# Patient Record
Sex: Female | Born: 1955
Health system: Southern US, Community
[De-identification: ages and names within clinical notes are randomized; demographics above are authoritative.]

## PROBLEM LIST (undated history)

## (undated) DIAGNOSIS — J45909 Unspecified asthma, uncomplicated: Secondary | ICD-10-CM

## (undated) DIAGNOSIS — M549 Dorsalgia, unspecified: Secondary | ICD-10-CM

## (undated) HISTORY — PX: BACK SURGERY: SHX140

---

## 2002-03-03 ENCOUNTER — Encounter: Admission: RE | Admit: 2002-03-03 | Discharge: 2002-03-03 | Payer: Self-pay | Admitting: Family Medicine

## 2002-03-03 ENCOUNTER — Encounter: Payer: Self-pay | Admitting: Family Medicine

## 2002-03-15 ENCOUNTER — Observation Stay (HOSPITAL_COMMUNITY): Admission: RE | Admit: 2002-03-15 | Discharge: 2002-03-16 | Payer: Self-pay | Admitting: General Surgery

## 2003-01-18 ENCOUNTER — Encounter: Payer: Self-pay | Admitting: *Deleted

## 2003-01-18 ENCOUNTER — Emergency Department (HOSPITAL_COMMUNITY): Admission: EM | Admit: 2003-01-18 | Discharge: 2003-01-18 | Payer: Self-pay | Admitting: *Deleted

## 2003-07-15 ENCOUNTER — Encounter: Payer: Self-pay | Admitting: Family Medicine

## 2003-07-15 ENCOUNTER — Ambulatory Visit (HOSPITAL_COMMUNITY): Admission: RE | Admit: 2003-07-15 | Discharge: 2003-07-15 | Payer: Self-pay | Admitting: Family Medicine

## 2003-07-25 ENCOUNTER — Encounter: Payer: Self-pay | Admitting: Family Medicine

## 2003-07-25 ENCOUNTER — Ambulatory Visit (HOSPITAL_COMMUNITY): Admission: RE | Admit: 2003-07-25 | Discharge: 2003-07-25 | Payer: Self-pay | Admitting: Family Medicine

## 2003-07-26 ENCOUNTER — Encounter: Payer: Self-pay | Admitting: Family Medicine

## 2003-07-26 ENCOUNTER — Ambulatory Visit (HOSPITAL_COMMUNITY): Admission: RE | Admit: 2003-07-26 | Discharge: 2003-07-26 | Payer: Self-pay | Admitting: Family Medicine

## 2003-08-03 ENCOUNTER — Ambulatory Visit (HOSPITAL_COMMUNITY): Admission: RE | Admit: 2003-08-03 | Discharge: 2003-08-03 | Payer: Self-pay | Admitting: Pulmonary Disease

## 2003-11-14 ENCOUNTER — Ambulatory Visit (HOSPITAL_COMMUNITY): Admission: RE | Admit: 2003-11-14 | Discharge: 2003-11-14 | Payer: Self-pay | Admitting: Family Medicine

## 2004-02-07 ENCOUNTER — Ambulatory Visit (HOSPITAL_COMMUNITY): Admission: RE | Admit: 2004-02-07 | Discharge: 2004-02-07 | Payer: Self-pay | Admitting: Family Medicine

## 2004-03-08 ENCOUNTER — Ambulatory Visit (HOSPITAL_COMMUNITY): Admission: RE | Admit: 2004-03-08 | Discharge: 2004-03-08 | Payer: Self-pay | Admitting: Family Medicine

## 2005-08-15 ENCOUNTER — Encounter: Admission: RE | Admit: 2005-08-15 | Discharge: 2005-08-15 | Payer: Self-pay | Admitting: Obstetrics and Gynecology

## 2005-09-04 ENCOUNTER — Encounter: Admission: RE | Admit: 2005-09-04 | Discharge: 2005-09-04 | Payer: Self-pay | Admitting: Obstetrics and Gynecology

## 2006-07-09 ENCOUNTER — Ambulatory Visit (HOSPITAL_COMMUNITY): Admission: RE | Admit: 2006-07-09 | Discharge: 2006-07-09 | Payer: Self-pay | Admitting: Family Medicine

## 2007-02-19 ENCOUNTER — Emergency Department (HOSPITAL_COMMUNITY): Admission: EM | Admit: 2007-02-19 | Discharge: 2007-02-19 | Payer: Self-pay | Admitting: Family Medicine

## 2007-02-24 ENCOUNTER — Inpatient Hospital Stay (HOSPITAL_COMMUNITY): Admission: EM | Admit: 2007-02-24 | Discharge: 2007-02-26 | Payer: Self-pay | Admitting: Emergency Medicine

## 2007-06-23 ENCOUNTER — Ambulatory Visit (HOSPITAL_COMMUNITY): Admission: RE | Admit: 2007-06-23 | Discharge: 2007-06-23 | Payer: Self-pay | Admitting: Family Medicine

## 2008-10-19 ENCOUNTER — Emergency Department (HOSPITAL_COMMUNITY): Admission: EM | Admit: 2008-10-19 | Discharge: 2008-10-19 | Payer: Self-pay | Admitting: Emergency Medicine

## 2008-12-13 ENCOUNTER — Emergency Department (HOSPITAL_COMMUNITY): Admission: EM | Admit: 2008-12-13 | Discharge: 2008-12-13 | Payer: Self-pay | Admitting: Family Medicine

## 2010-03-12 ENCOUNTER — Ambulatory Visit (HOSPITAL_COMMUNITY): Admission: RE | Admit: 2010-03-12 | Discharge: 2010-03-12 | Payer: Self-pay | Admitting: Family Medicine

## 2010-03-17 ENCOUNTER — Emergency Department (HOSPITAL_COMMUNITY): Admission: EM | Admit: 2010-03-17 | Discharge: 2010-03-17 | Payer: Self-pay | Admitting: Emergency Medicine

## 2010-04-05 ENCOUNTER — Ambulatory Visit (HOSPITAL_COMMUNITY): Admission: RE | Admit: 2010-04-05 | Discharge: 2010-04-05 | Payer: Self-pay | Admitting: Family Medicine

## 2010-04-11 ENCOUNTER — Encounter (HOSPITAL_COMMUNITY): Admission: RE | Admit: 2010-04-11 | Discharge: 2010-04-11 | Payer: Self-pay | Admitting: Family Medicine

## 2010-11-28 ENCOUNTER — Emergency Department (HOSPITAL_COMMUNITY)
Admission: EM | Admit: 2010-11-28 | Discharge: 2010-11-28 | Payer: Self-pay | Source: Home / Self Care | Admitting: Emergency Medicine

## 2010-12-11 ENCOUNTER — Ambulatory Visit (HOSPITAL_COMMUNITY)
Admission: RE | Admit: 2010-12-11 | Discharge: 2010-12-11 | Payer: Self-pay | Source: Home / Self Care | Attending: Internal Medicine | Admitting: Internal Medicine

## 2010-12-20 ENCOUNTER — Ambulatory Visit (HOSPITAL_COMMUNITY)
Admission: RE | Admit: 2010-12-20 | Discharge: 2010-12-21 | Payer: Self-pay | Source: Home / Self Care | Attending: Neurosurgery | Admitting: Neurosurgery

## 2010-12-24 LAB — URINALYSIS, ROUTINE W REFLEX MICROSCOPIC
Bilirubin Urine: NEGATIVE
Hgb urine dipstick: NEGATIVE
Ketones, ur: NEGATIVE mg/dL
Nitrite: NEGATIVE
Protein, ur: NEGATIVE mg/dL
Specific Gravity, Urine: 1.024 (ref 1.005–1.030)
Urine Glucose, Fasting: NEGATIVE mg/dL
Urobilinogen, UA: 0.2 mg/dL (ref 0.0–1.0)
pH: 6 (ref 5.0–8.0)

## 2010-12-24 LAB — BASIC METABOLIC PANEL
BUN: 15 mg/dL (ref 6–23)
CO2: 25 mEq/L (ref 19–32)
Calcium: 9.3 mg/dL (ref 8.4–10.5)
Chloride: 103 mEq/L (ref 96–112)
Creatinine, Ser: 0.91 mg/dL (ref 0.4–1.2)
GFR calc Af Amer: >60 mL/min (ref >60)
GFR calc non Af Amer: >60 mL/min (ref >60)
Glucose, Bld: 112 mg/dL — ABNORMAL HIGH (ref 70–99)
Potassium: 3.6 mEq/L (ref 3.5–5.1)
Sodium: 138 mEq/L (ref 135–145)

## 2010-12-24 LAB — PROTIME-INR
INR: 0.99 (ref 0.00–1.49)
Prothrombin Time: 13.3 seconds (ref 11.6–15.2)

## 2010-12-24 LAB — CBC
HCT: 37.6 % (ref 36.0–46.0)
Hemoglobin: 12.7 g/dL (ref 12.0–15.0)
MCH: 30.5 pg (ref 26.0–34.0)
MCHC: 33.8 g/dL (ref 30.0–36.0)
MCV: 90.4 fL (ref 78.0–100.0)
Platelets: 277 10*3/uL (ref 150–400)
RBC: 4.16 MIL/uL (ref 3.87–5.11)
RDW: 12.5 % (ref 11.5–15.5)
WBC: 7.3 10*3/uL (ref 4.0–10.5)

## 2010-12-24 LAB — SURGICAL PCR SCREEN
MRSA, PCR: NEGATIVE
Staphylococcus aureus: NEGATIVE

## 2010-12-24 LAB — APTT: aPTT: 30 seconds (ref 24–37)

## 2010-12-30 ENCOUNTER — Encounter: Payer: Self-pay | Admitting: Orthopedic Surgery

## 2010-12-30 ENCOUNTER — Encounter: Payer: Self-pay | Admitting: Obstetrics and Gynecology

## 2010-12-31 NOTE — Op Note (Signed)
[name], [name]               ACCOUNT NO.:  [id number]  MEDICAL RECORD NO.:  [id number]          PATIENT TYPE:  OIB  LOCATION:  3006                         FACILITY:  [facility]  PHYSICIAN:  [name], M.D.  DATE OF BIRTH:  11-08-1956  DATE OF PROCEDURE:  12/20/2010 DATE OF DISCHARGE:                              OPERATIVE REPORT   PREOPERATIVE DIAGNOSIS:  Herniated nucleus pulposus right L4-L5 with radiculopathy.  POSTOPERATIVE DIAGNOSIS:  Herniated nucleus pulposus right L4-L5 with radiculopathy.  PROCEDURE:  Right L4-L5 semi-hemilaminectomy and diskectomy, microdissection with microscope.  SURGEON:  Emily Fake, MD  ASSISTANT:  Emily Dama, MD  General endotracheal tube anesthesia.  ESTIMATED BLOOD LOSS:  Minimal.  BLOOD GIVEN:  None.  DRAINS:  None.  COMPLICATIONS:  None.  REASON FOR PROCEDURE:  The patient is a 55 year old right-handed woman who has had a 2-3-week history of severe back and right leg pain with numbness and tingling and some weakness in right EHL, decreased sensation in the right L5 distribution.  MRI was done showing a large disk herniation with caudal extension of a fragment in the right L4-L5. The patient was brought in for decompression and diskectomy.  PROCEDURE IN DETAIL:  The patient was brought in the operating room. General anesthesia was induced.  The patient was placed in prone position on a Wilson frame with all pressure points padded.  The patient was prepped and draped in sterile fashion.  The site of incision was injected with 10 mL of 1% lidocaine with epinephrine.  Needle was placed in interspace.  X-rays were obtained showing the needle was pointing more towards the L3-L4 interspace.  An incision was then made centered below where the needle was.  Incision was taken down to the fascia. Hemostasis was obtained with Bovie cauterization.  The fascia was incised with Bovie and subperiosteal dissection was done over  the spinous processes and lamina of L4 and L5 out to the facets.  Self- retaining retractors were placed.  Markers were placed in the interspace.  Another x-ray was obtained and this confirmed our positioning at the L4-L5 level.  Microscope was brought in for microdissection.  At this point, a high-speed drill was used to start decompressive semi-hemilaminectomy and medial facetectomy.  This was continued with Kerrison punches and ligamentum flavum was removed, decompressing the canal.  Therefore at the L5, we explored the epidural space and found large free fragments of disk up under the L5 nerve root below the disk space.  Using various hooks, we were able to mobilize off the free fragments of disk and removed the disk in several pieces.  We continued exploring interspace from the L5 __________ foramen, but could find no more fragments of disk.  We explored the disk space and though there was a disk bulge there, there was no obvious opening into the disk space and we did not open the disk space.  Again, we explored looking for more fragments of disk and did not find any.  We had good decompression of the thecal sac and the L5 root.  We then explored the foramen in the L4 root  which went out of its foramen without any compression.  We had hemostasis with Gelfoam and thrombin.  This was then irrigated out.  We irrigated with antibiotic solution.  We had good hemostasis.  Retractors were removed.  The fascia closed with 0 Vicryl interrupted sutures.  Subcutaneous tissue closed with 0, 2-0, and 3-0 Vicryl interrupted sutures.  Skin closed with benzoin and Steri-Strips. Dressing was placed.  The patient was placed back into supine position, awoken from anesthesia, and transferred to recovery room in stable condition.          ______________________________ [name], M.D.     JRH/MEDQ  D:  12/20/2010  T:  12/21/2010  Job:  [id number]  Electronically Signed by Colon Branch M.D. on  12/31/2010 12:38:11 PM

## 2011-02-19 IMAGING — CR DG CHEST 2V
2 series · 2 of 2 positions shown · non-contrast
Comparison: 02/24/2007

CLINICAL DATA: Posterior mid back pain into right side, asthma,
fatigue

CHEST - 2 VIEW

[view not recorded (1 of 2)]
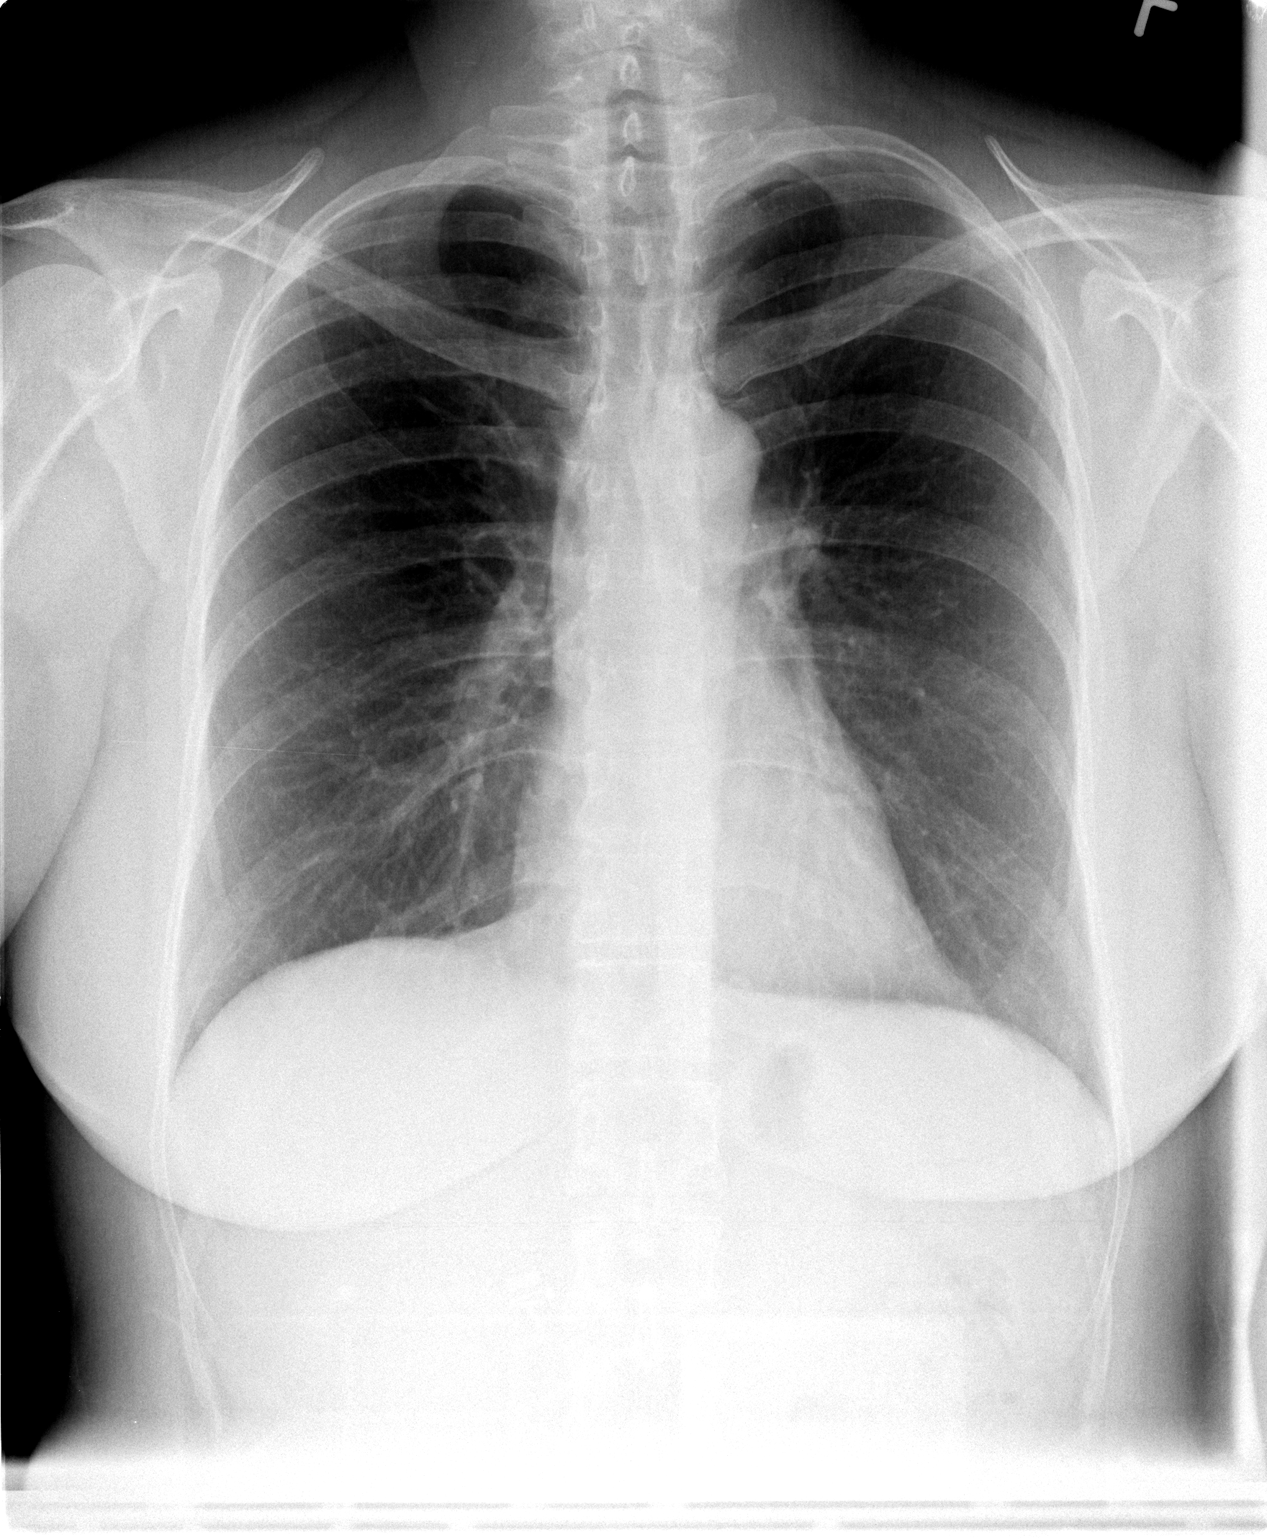

[view not recorded (2 of 2)]
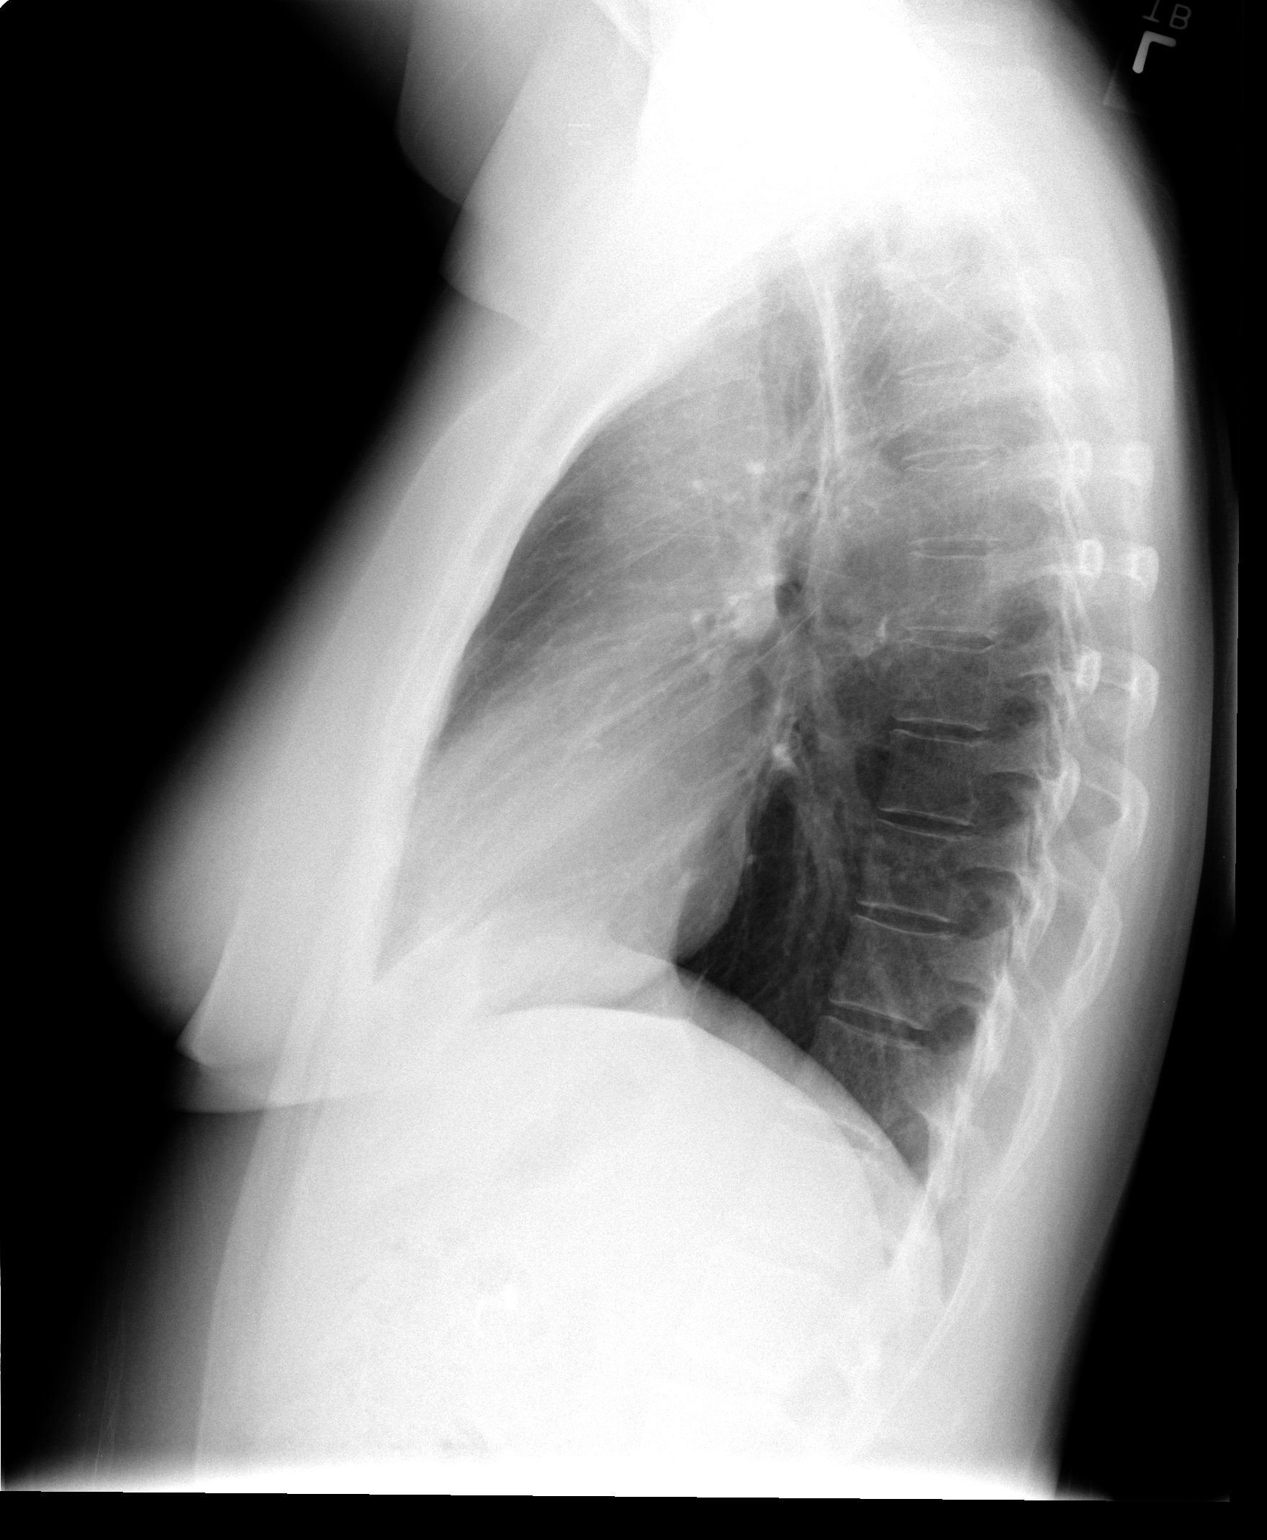

[2 of 2 positions shown; findings below may reference images not displayed]

FINDINGS: Normal heart size, mediastinal contours, and pulmonary vascularity.
Lungs clear.
No pleural effusion or pneumothorax.
IMPRESSION: No acute abnormalities.

## 2011-02-27 LAB — COMPREHENSIVE METABOLIC PANEL
ALT: 19 U/L (ref 0–35)
AST: 29 U/L (ref 0–37)
Calcium: 9.3 mg/dL (ref 8.4–10.5)
GFR calc Af Amer: >60 mL/min (ref >60)
Sodium: 137 mEq/L (ref 135–145)
Total Protein: 7.5 g/dL (ref 6.0–8.3)

## 2011-02-27 LAB — URINALYSIS, ROUTINE W REFLEX MICROSCOPIC
Glucose, UA: NEGATIVE mg/dL
Hgb urine dipstick: NEGATIVE
Ketones, ur: NEGATIVE mg/dL
Protein, ur: NEGATIVE mg/dL

## 2011-02-27 LAB — CBC
MCHC: 34.2 g/dL (ref 30.0–36.0)
RDW: 13.2 % (ref 11.5–15.5)

## 2011-02-27 LAB — URINE MICROSCOPIC-ADD ON

## 2011-02-27 LAB — DIFFERENTIAL
Eosinophils Absolute: 0.1 10*3/uL (ref 0.0–0.7)
Eosinophils Relative: 1 % (ref 0–5)
Lymphs Abs: 2.4 10*3/uL (ref 0.7–4.0)
Monocytes Relative: 7 % (ref 3–12)

## 2011-03-01 ENCOUNTER — Emergency Department (HOSPITAL_COMMUNITY)
Admission: EM | Admit: 2011-03-01 | Discharge: 2011-03-02 | Disposition: A | Discharge status: Home / Self Care | Payer: 59 | Attending: Emergency Medicine | Admitting: Emergency Medicine

## 2011-03-01 DIAGNOSIS — R109 Unspecified abdominal pain: Secondary | ICD-10-CM | POA: Insufficient documentation

## 2011-03-01 DIAGNOSIS — J45909 Unspecified asthma, uncomplicated: Secondary | ICD-10-CM | POA: Insufficient documentation

## 2011-03-01 DIAGNOSIS — R319 Hematuria, unspecified: Secondary | ICD-10-CM | POA: Insufficient documentation

## 2011-03-01 LAB — URINALYSIS, ROUTINE W REFLEX MICROSCOPIC
Bilirubin Urine: NEGATIVE
Glucose, UA: NEGATIVE mg/dL
Ketones, ur: NEGATIVE mg/dL
Nitrite: NEGATIVE
Protein, ur: NEGATIVE mg/dL
Specific Gravity, Urine: 1.024 (ref 1.005–1.030)
Urobilinogen, UA: 0.2 mg/dL (ref 0.0–1.0)
pH: 6 (ref 5.0–8.0)

## 2011-03-01 LAB — URINE MICROSCOPIC-ADD ON

## 2011-03-02 ENCOUNTER — Emergency Department (HOSPITAL_COMMUNITY): Payer: 59

## 2011-04-02 ENCOUNTER — Other Ambulatory Visit (HOSPITAL_COMMUNITY): Payer: Self-pay | Admitting: Neurosurgery

## 2011-04-02 ENCOUNTER — Ambulatory Visit (HOSPITAL_COMMUNITY): Admission: RE | Admit: 2011-04-02 | Payer: 59 | Source: Ambulatory Visit

## 2011-04-02 ENCOUNTER — Ambulatory Visit (HOSPITAL_COMMUNITY)
Admission: RE | Admit: 2011-04-02 | Discharge: 2011-04-02 | Disposition: A | Discharge status: Home / Self Care | Payer: 59 | Source: Ambulatory Visit | Attending: Neurosurgery | Admitting: Neurosurgery

## 2011-04-02 DIAGNOSIS — IMO0002 Reserved for concepts with insufficient information to code with codable children: Secondary | ICD-10-CM

## 2011-04-02 DIAGNOSIS — M545 Low back pain, unspecified: Secondary | ICD-10-CM | POA: Insufficient documentation

## 2011-04-02 DIAGNOSIS — M549 Dorsalgia, unspecified: Secondary | ICD-10-CM

## 2011-04-02 DIAGNOSIS — M792 Neuralgia and neuritis, unspecified: Secondary | ICD-10-CM

## 2011-04-02 DIAGNOSIS — M51379 Other intervertebral disc degeneration, lumbosacral region without mention of lumbar back pain or lower extremity pain: Secondary | ICD-10-CM | POA: Insufficient documentation

## 2011-04-02 DIAGNOSIS — M5106 Intervertebral disc disorders with myelopathy, lumbar region: Secondary | ICD-10-CM

## 2011-04-02 DIAGNOSIS — G959 Disease of spinal cord, unspecified: Secondary | ICD-10-CM

## 2011-04-02 DIAGNOSIS — R209 Unspecified disturbances of skin sensation: Secondary | ICD-10-CM | POA: Insufficient documentation

## 2011-04-02 DIAGNOSIS — M519 Unspecified thoracic, thoracolumbar and lumbosacral intervertebral disc disorder: Secondary | ICD-10-CM

## 2011-04-02 DIAGNOSIS — M4716 Other spondylosis with myelopathy, lumbar region: Secondary | ICD-10-CM

## 2011-04-02 DIAGNOSIS — M5137 Other intervertebral disc degeneration, lumbosacral region: Secondary | ICD-10-CM | POA: Insufficient documentation

## 2011-04-02 MED ORDER — GADOBENATE DIMEGLUMINE 529 MG/ML IV SOLN
20.0000 mL | Freq: Once | INTRAVENOUS | Status: AC | PRN
  Administered 2011-04-02: 20 mL via INTRAVENOUS

## 2011-04-26 NOTE — Discharge Summary (Signed)
[name], [name]               ACCOUNT NO.:  [id number]   MEDICAL RECORD NO.:  [id number]          PATIENT TYPE:  INP   LOCATION:  A209                          FACILITY:  [facility]   PHYSICIAN:  Kirk Ruths, M.D.DATE OF BIRTH:  17-May-1956   DATE OF ADMISSION:  02/24/2007  DATE OF DISCHARGE:  03/20/2008LH                               DISCHARGE SUMMARY   DISCHARGE DIAGNOSES:  1. Status asthmaticus.  2. Respiratory alkalosis.  3. Hypokalemia.   HOSPITAL COURSE:  This 55 year old female presented to the emergency  room after a week-long history of upper respiratory infection.  The  patient had been seen in the office twice the past week and treated with  cough medicines, albuterol and Avelox, but the patient got progressively  short of breath.  In the emergency room, she was found to have a  respiratory alkalosis, extremely tachypneic and her D-dimer was slightly  up at 0.6.  The patient was admitted to the floor and begun on IV  steroids.  Even though she had reported she was allergic to prednisone,  she had no problem with the Solu-Medrol.  She also was given nebulizers.  The patient also had a CT on admission which showed no evidence of  pulmonary embolus.  The patient's status asthmaticus broke after the  second nebulizer treatment and a dose of Solu-Medrol.  By the following  morning, she was essentially clear.  The patient continued to receive  Solu-Medrol and nebulizers.  Her potassium was replaced, since it was  3.2 on admission.  She was also put given IV Levaquin and Singulair.  The patient was stable the second hospital day and was discharged home  on an albuterol nebulizer, hydrocodone cough syrup and Avelox and will  be followed in the office as needed.  Also, she was on Singulair and  prednisone taper.      Kirk Ruths, M.D.  Electronically Signed     WMM/MEDQ  D:  04/01/2007  T:  04/01/2007  Job:  [id number]

## 2011-04-26 NOTE — H&P (Signed)
NAMENEAVE, LENGER               ACCOUNT NO.:  [id number]   MEDICAL RECORD NO.:  [id number]          PATIENT TYPE:  INP   LOCATION:  A209                          FACILITY:  [facility]   PHYSICIAN:  Kirk Ruths, M.D.DATE OF BIRTH:  [birth year]   DATE OF ADMISSION:  02/24/2007  DATE OF DISCHARGE:  LH                              HISTORY & PHYSICAL   CHIEF COMPLAINT:  Shortness of breath.   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  The patient was seen in urgent care and treated with some  albuterol and cough medicine.  She continued to have progressive  shortness of breath, seen by Dr. Phillips Odor and begun on Avelox.  The  patient has continued with her shortness of breath and nonproductive  cough.   She is seen in the emergency room where she is found to have a  respiratory alkalosis, extremely tachypneic, approximately 30  respiratory rate.  Lungs with a very faint expiratory wheeze.  Her D-  dimer was slightly up at 0.6.  subsequent CT showed no evidence of  pulmonary embolism.  The patient was refractory to albuterol/Atrovent  nebulizers while in the ER, so she was admitted for intensive  respiratory toiletry and further evaluation.   PAST MEDICAL HISTORY:  1. She has a longstanding history of asthma.  She used to be on      Singulair for this but insurance refused to cover it, so it was      discontinued several months ago.  The patient is status post      cholecystectomy and hysterectomy.   ALLERGIES:  1. AUGMENTIN.  2. CODEINE.  3. PREDNISONE.   She does seem to tolerated cough syrup with hydrocodone in it without  problems. The prednisone problem relates to she was on, what she  describes as very high doses, for a thyroid condition when she had  sudden onset of flushing and tachycardia.  She was told that she was  having an allergic reaction to high doses of  prednisone, although the  patient did tolerate 62.5 mg of Solu-Medrol last night.   SOCIAL HISTORY:  She is a nonsmoker, nondrinker.   REVIEW OF SYSTEMS:  Denies chest pain, productive cough, diarrhea,  vomiting, leg pain.   PHYSICAL EXAMINATION:  GENERAL:  She is a well-developed female who is  obviously tachypneic.  VITAL SIGNS:  Temperature 98 degrees, respiratory rate 30, pulse 88 and  regular, blood pressure 130/80.  HEENT:  TMs normal.  Pupils equal, round, reactive to light and  accommodation.  Oropharynx benign.  NECK:  Supple.  No JVD, bruits, or thyromegaly.  RESPIRATORY:  Lungs essentially clear with mild end stage expiratory  wheeze.  CARDIOVASCULAR:  Regular sinus rhythm.  No murmurs, rubs, or gallops.  ABDOMEN:  Soft, nontender.  EXTREMITIES:  No clubbing, cyanosis, or edema.  No tenderness.  NEUROLOGIC:  Grossly intact.   ASSESSMENT:  Probable status asthmaticus, subclinical, although PE must  be entertained.      Kirk Ruths,  M.D.  Electronically Signed     WMM/MEDQ  D:  02/25/2007  T:  02/25/2007  Job:  [id number]

## 2011-04-26 NOTE — H&P (Signed)
[facility]  Patient:    [name], [name] Visit Number: [id number] MRN: [id number]          Service Type: Attending:  Franky Macho, M.D. Dictated by:   Franky Macho, M.D. Adm. Date:  03/15/02   CC:         Almond Lint, M.D., [place], [place]   History and Physical  AGE:  55 years old.  CHIEF COMPLAINT:  Biliary colic secondary to cholelithiasis.  HISTORY OF PRESENT ILLNESS:  The patient is a 55 year old white female who is referred for evaluation and treatment of biliary colic secondary to cholelithiasis.  She has been having worsening right upper quadrant abdominal pain with radiation to the back, nausea, bloating, and food intolerance for the past few weeks.  It is now daily in nature.  No fever, chills, vomiting, or jaundice have been noted.  PAST MEDICAL HISTORY:  Unremarkable.  PAST SURGICAL HISTORY: 1. Cystoscopy. 2. Hysterectomy.  CURRENT MEDICATIONS:  Premarin 1 tablet p.o. q.d.  ALLERGIES:  CODEINE and DERIVATIVES.  REVIEW OF SYSTEMS:  Unremarkable.  PHYSICAL EXAMINATION:  GENERAL:  Well-developed, well-nourished white female in no acute distress.  VITAL SIGNS:  Afebrile, vital signs stable.  HEENT:  No scleral icterus.  LUNGS:  Clear to auscultation, with equal breath sounds bilaterally.  HEART:  Regular rate and rhythm without S3, S4, or murmurs.  ABDOMEN:  Soft and nondistended.  Tender in the right upper quadrant to palpation.  No hepatosplenomegaly, masses, or hernias are identified.  LABORATORY DATA:  Ultrasound of the gallbladder reveals cholelithiasis with a normal common bile duct.  IMPRESSION:  Biliary colic, cholelithiasis.  PLAN:  The patient is scheduled for laparoscopic cholecystectomy on March 15, 2002.  The risks and benefits of the procedure including bleeding, infection, hepatobiliary injury, and the possibility of an open procedure were fully explained to the patient, who gave informed consent. Dictated by:    Franky Macho, M.D. Attending:  Franky Macho, M.D. DD:  03/11/02 TD:  03/11/02 Job: [id number]

## 2011-04-26 NOTE — Procedures (Signed)
   [name], [name]                         ACCOUNT NO.:  [id number]   MEDICAL RECORD NO.:  [id number]                   PATIENT TYPE:  OUT   LOCATION:  RESP                                 FACILITY:  [facility]   PHYSICIAN:  Edward L. Juanetta Gosling, M.D.             DATE OF BIRTH:  [birth year]   DATE OF PROCEDURE:  DATE OF DISCHARGE:                              PULMONARY FUNCTION TEST   IMPRESSION:  1. Spirometry is normal.  2. Lung volumes are normal.  3. There is minimal decrease in DLCO.                                               Edward L. Juanetta Gosling, M.D.    ELH/MEDQ  D:  08/03/2003  T:  08/03/2003  Job:  [id number]

## 2011-04-26 NOTE — Op Note (Signed)
[facility]  Patient:    [name], [name] Visit Number: [id number] MRN: [id number]          Service Type: DSU Location: DAY Attending Physician:  Dalia Heading Dictated by:   Franky Macho, M.D. Proc. Date: 03/15/02 Admit Date:  03/15/2002   CC:         Dr. Almond Lint in [place].   Operative Report  PREOPERATIVE DIAGNOSIS:  Biliary colic, cholelithiasis.  POSTOPERATIVE DIAGNOSIS:  Biliary colic, cholelithiasis.  PROCEDURE:  Laparoscopic cholecystectomy.  SURGEON:  Franky Macho, M.D.  ASSISTANT:  Arna Snipe, M.D.  ANESTHESIA:  General endotracheal.  INDICATIONS:  The patient is a 55 year old white female who presents with biliary colic secondary to cholelithiasis.  The risks and benefits of the procedure, including bleeding, infection, hepatobiliary injury, and the possibility of an open procedure were fully explained to the patient, who gave informed consent.  PROCEDURE NOTE:  The patient was placed in the supine position.  After induction of general endotracheal anesthesia, the abdomen was prepped and draped using the usual sterile technique with Betadine.  A supraumbilical incision was made down to the fascia.  A Veress needle was introduced into the abdominal cavity and confirmation of placement was done using the saline drop test.  The abdomen was then insufflated to 16 mmHg pressure.  An 11-mm trocar was then introduced into the abdominal cavity under direct visualization without difficulty.  The patient was placed in the reverse Trendelenburg position, and an additional 11-mm trocar was placed in the epigastric region, and 5-mm trocars were placed in the right upper quadrant and right flank regions.  The liver was inspected and noted to be within normal limits.  The gallbladder was retracted superiorly and laterally. The dissection was begun around the infundibulum of the gallbladder.  The cystic duct was first identified.  The  junction to the infundibulum was identified.  Endoclips were placed proximally and distally on the cystic duct, and the cystic duct was divided.  This was likewise done to the cystic artery. The gallbladder was then freed away from the gallbladder fossa using Bovie electrocautery.  The gallbladder was delivered through the epigastric trocar site using an Endocatch bag.  The gallbladder fossa was then inspected, and no abnormal bleeding or bile leakage was noted.  Surgicel was placed in the gallbladder fossa.  The subhepatic space as well as the right hepatic gutter were irrigated with normal saline.  All fluid and air were then evacuated from the abdominal cavity prior to removal of the trocars.  All wounds were irrigated with normal saline.  All wounds were injected with 0.5% Marcaine.  The epigastric fascia as well as supraumbilical fascia were reapproximated using #0 Vicryl interrupted sutures.  All skin incisions were closed using a 4-0 Vicryl subcuticular suture.  Steri-Strips and a dry sterile dressing were applied.  All needle counts were corrects at the end of the procedure.  The patient was extubated in the operating room and went back to the recovery room, awakened in stable condition.  COMPLICATIONS:  None.  SPECIMEN:  Gallbladder with stones.  BLOOD LOSS:  Minimal. Dictated by:   Franky Macho, M.D. Attending Physician:  Dalia Heading DD:  03/15/02 TD:  03/16/02 Job: [id number]

## 2011-04-26 NOTE — Discharge Summary (Signed)
NAMETOWANDA, HORNSTEIN               ACCOUNT NO.:  [id number]   MEDICAL RECORD NO.:  [id number]          PATIENT TYPE:  INP   LOCATION:  A209                          FACILITY:  [facility]   PHYSICIAN:  Madelin Rear. Sherwood Gambler, MD  DATE OF BIRTH:  1956-09-06   DATE OF ADMISSION:  02/24/2007  DATE OF DISCHARGE:  LH                               DISCHARGE SUMMARY   DISCHARGE SUMMARY   Dictation ended at this point.      Madelin Rear. Sherwood Gambler, MD  Electronically Signed     LJF/MEDQ  D:  02/25/2007  T:  02/25/2007  Job:  [id number]

## 2011-08-19 ENCOUNTER — Ambulatory Visit (HOSPITAL_COMMUNITY)
Admission: RE | Admit: 2011-08-19 | Discharge: 2011-08-19 | Disposition: A | Discharge status: Home / Self Care | Payer: 59 | Source: Ambulatory Visit | Attending: Internal Medicine | Admitting: Internal Medicine

## 2011-08-19 ENCOUNTER — Other Ambulatory Visit (HOSPITAL_COMMUNITY): Payer: Self-pay | Admitting: Internal Medicine

## 2011-08-19 DIAGNOSIS — J45909 Unspecified asthma, uncomplicated: Secondary | ICD-10-CM

## 2011-08-19 DIAGNOSIS — J042 Acute laryngotracheitis: Secondary | ICD-10-CM

## 2011-12-16 ENCOUNTER — Other Ambulatory Visit (HOSPITAL_COMMUNITY): Payer: Self-pay | Admitting: Internal Medicine

## 2011-12-16 DIAGNOSIS — Z139 Encounter for screening, unspecified: Secondary | ICD-10-CM

## 2011-12-19 ENCOUNTER — Ambulatory Visit (HOSPITAL_COMMUNITY): Payer: 59

## 2011-12-24 ENCOUNTER — Ambulatory Visit (HOSPITAL_COMMUNITY): Payer: 59

## 2012-11-09 ENCOUNTER — Ambulatory Visit (HOSPITAL_COMMUNITY)
Admission: RE | Admit: 2012-11-09 | Discharge: 2012-11-09 | Disposition: A | Discharge status: Home / Self Care | Payer: 59 | Source: Ambulatory Visit | Attending: Internal Medicine | Admitting: Internal Medicine

## 2012-11-09 DIAGNOSIS — Z1231 Encounter for screening mammogram for malignant neoplasm of breast: Secondary | ICD-10-CM | POA: Insufficient documentation

## 2012-11-09 DIAGNOSIS — Z139 Encounter for screening, unspecified: Secondary | ICD-10-CM

## 2012-12-09 ENCOUNTER — Emergency Department (HOSPITAL_COMMUNITY): Payer: 59

## 2012-12-09 ENCOUNTER — Encounter (HOSPITAL_COMMUNITY): Payer: Self-pay | Admitting: *Deleted

## 2012-12-09 ENCOUNTER — Emergency Department (HOSPITAL_COMMUNITY)
Admission: EM | Admit: 2012-12-09 | Discharge: 2012-12-09 | Disposition: A | Discharge status: Home Health | Payer: 59 | Attending: Emergency Medicine | Admitting: Emergency Medicine

## 2012-12-09 DIAGNOSIS — R51 Headache: Secondary | ICD-10-CM | POA: Insufficient documentation

## 2012-12-09 DIAGNOSIS — J45909 Unspecified asthma, uncomplicated: Secondary | ICD-10-CM | POA: Insufficient documentation

## 2012-12-09 DIAGNOSIS — R112 Nausea with vomiting, unspecified: Secondary | ICD-10-CM | POA: Insufficient documentation

## 2012-12-09 DIAGNOSIS — J3489 Other specified disorders of nose and nasal sinuses: Secondary | ICD-10-CM | POA: Insufficient documentation

## 2012-12-09 DIAGNOSIS — R55 Syncope and collapse: Secondary | ICD-10-CM | POA: Insufficient documentation

## 2012-12-09 DIAGNOSIS — R42 Dizziness and giddiness: Secondary | ICD-10-CM | POA: Insufficient documentation

## 2012-12-09 DIAGNOSIS — J069 Acute upper respiratory infection, unspecified: Secondary | ICD-10-CM | POA: Insufficient documentation

## 2012-12-09 DIAGNOSIS — Z79899 Other long term (current) drug therapy: Secondary | ICD-10-CM | POA: Insufficient documentation

## 2012-12-09 HISTORY — DX: Unspecified asthma, uncomplicated: J45.909

## 2012-12-09 HISTORY — DX: Dorsalgia, unspecified: M54.9

## 2012-12-09 LAB — POCT I-STAT, CHEM 8
BUN: 12 mg/dL (ref 6–23)
Calcium, Ion: 1.14 mmol/L (ref 1.12–1.23)
Creatinine, Ser: 0.8 mg/dL (ref 0.50–1.10)
Glucose, Bld: 100 mg/dL — ABNORMAL HIGH (ref 70–99)
TCO2: 26 mmol/L (ref 0–100)

## 2012-12-09 MED ORDER — LEVOFLOXACIN 500 MG PO TABS: 500.0000 mg | ORAL_TABLET | Freq: Every day | ORAL | Status: DC

## 2012-12-09 MED ORDER — SODIUM CHLORIDE 0.9 % IV BOLUS (SEPSIS)
1000.0000 mL | Freq: Once | INTRAVENOUS | Status: AC
  Administered 2012-12-09: 1000 mL via INTRAVENOUS

## 2012-12-09 MED ORDER — ONDANSETRON 8 MG PO TBDP: 8.0000 mg | ORAL_TABLET | Freq: Three times a day (TID) | ORAL | Status: DC | PRN

## 2012-12-09 NOTE — ED Provider Notes (Signed)
History     CSN: [id number]  Arrival date & time 12/09/12  8469   First MD Initiated Contact with Patient 12/09/12 [phone]      Chief Complaint  Patient presents with  . Near Syncope    (Consider location/radiation/quality/duration/timing/severity/associated sxs/prior treatment) HPI Comments: Patient presents with complaint of syncopal episode this morning. Patient states that she woke with a bad headache that she attributes to sinus symptoms which began yesterday. She has had similar symptoms in the past with upper respiratory infections. While she was eating breakfast, the patient states she began to feel lightheaded and nauseous. She then passed out. Patient's husband witnessed the episode and states that she slid out of her chair onto the floor. She did not appear to strike her head. She then vomited twice but is breathing normally. No seizure-like activity. EMS was called and found patient to be hypotensive into the 80s systolic. She was given Zofran and fluids prior to arrival. Patient denies fever, chills, abdominal pain, diarrhea, urinary symptoms. She denies blurry vision or weakness or numbness, or tingling in her extremities. Patient did have a mild headache yesterday. Onset of symptoms acute. Course is resolved. Nothing makes symptoms better worse. No history of arrhythmia or heart problems.  The history is provided by the patient and the spouse.    Past Medical History  Diagnosis Date  . Asthma   . Back pain     Past Surgical History  Procedure Date  . Back surgery     No family history on file.  History  Substance Use Topics  . Smoking status: Not on file  . Smokeless tobacco: Not on file  . Alcohol Use: No    OB History    Grav Para Term Preterm Abortions TAB SAB Ect Mult Living                  Review of Systems  Constitutional: Negative for fever.  HENT: Positive for congestion and rhinorrhea. Negative for sore throat and sinus pressure.   Eyes: Negative  for redness.  Respiratory: Negative for cough.   Cardiovascular: Negative for chest pain and leg swelling.  Gastrointestinal: Positive for nausea and vomiting. Negative for abdominal pain and diarrhea.  Genitourinary: Negative for dysuria.  Musculoskeletal: Negative for myalgias.  Skin: Negative for rash.  Neurological: Positive for syncope, light-headedness and headaches. Negative for seizures, speech difficulty, weakness and numbness.    Allergies  Amoxicillin-pot clavulanate and Codeine  Home Medications   Current Outpatient Rx  Name  Route  Sig  Dispense  Refill  . DOCUSATE SODIUM 100 MG PO CAPS   Oral   Take 100 mg by mouth daily.         . IBUPROFEN 200 MG PO TABS   Oral   Take 200 mg by mouth every 6 (six) hours as needed. For pain           BP 120/74  Pulse 67  Temp 97.5 F (36.4 C) (Oral)  Resp 15  SpO2 100%  Physical Exam  Nursing note and vitals reviewed. Constitutional: She is oriented to person, place, and time. She appears well-developed and well-nourished.  HENT:  Head: Normocephalic and atraumatic.  Right Ear: Tympanic membrane, external ear and ear canal normal.  Left Ear: Tympanic membrane, external ear and ear canal normal.  Nose: Nose normal. Right sinus exhibits no maxillary sinus tenderness and no frontal sinus tenderness. Left sinus exhibits no maxillary sinus tenderness and no frontal sinus tenderness.  Mouth/Throat:  Uvula is midline, oropharynx is clear and moist and mucous membranes are normal.  Eyes: Conjunctivae normal, EOM and lids are normal. Pupils are equal, round, and reactive to light. Right eye exhibits no nystagmus. Left eye exhibits no nystagmus.  Neck: Normal range of motion. Neck supple.  Cardiovascular: Normal rate and regular rhythm.   Pulmonary/Chest: Effort normal and breath sounds normal.  Abdominal: Soft. There is no tenderness.  Musculoskeletal:       Cervical back: She exhibits normal range of motion, no tenderness  and no bony tenderness.  Neurological: She is alert and oriented to person, place, and time. She has normal strength and normal reflexes. No cranial nerve deficit or sensory deficit. She displays a negative Romberg sign. Coordination and gait normal. GCS eye subscore is 4. GCS verbal subscore is 5. GCS motor subscore is 6.  Skin: Skin is warm and dry.  Psychiatric: She has a normal mood and affect.    ED Course  Procedures (including critical care time)  Labs Reviewed  POCT I-STAT, CHEM 8 - Abnormal; Notable for the following:    Glucose, Bld 100 (*)     All other components within normal limits   Ct Head Wo Contrast  12/09/2012  *RADIOLOGY REPORT*  Clinical Data: Nausea this morning.  Syncopal episode.  No memory of the events. Headache.  CT HEAD WITHOUT CONTRAST  Technique:  Contiguous axial images were obtained from the base of the skull through the vertex without contrast.  Comparison: None  Findings: There is no intra or extra-axial fluid collection or mass lesion.  The basilar cisterns and ventricles have a normal appearance.  There is no CT evidence for acute infarction or hemorrhage.  Bone windows show no acute findings.  IMPRESSION: No evidence for acute intracranial abnormality.   Original Report Authenticated By: Norva Pavlov, M.D.      1. Syncope   2. Upper respiratory infection     8:47 AM Patient seen and examined. Work-up initiated. Medications ordered. D/w Dr. Radford Pax.   Vital signs reviewed and are as follows: Filed Vitals:   12/09/12 0831  BP: 120/74  Pulse: 67  Temp:   Resp:   BP 120/74  Pulse 67  Temp 97.5 F (36.4 C) (Oral)  Resp 15  SpO2 100%   Date: 12/09/2012  Rate: 58  Rhythm: sinus bradycardia  QRS Axis: normal  Intervals: normal  ST/T Wave abnormalities: normal  Conduction Disutrbances:first-degree A-V block  (PR 208)  Narrative Interpretation: no WPW, Brugada, long QT  Old EKG Reviewed: none available  Results reviewed. Patient informed.  She has been ambulating and feels well. Workup is reassuring.  Patient requesting prescription for levofloxacin. She states that she has had upper respiratory infections that moving to her chest causing severe asthma flare. Told patient not to take these, until she begins to have chest symptoms, and to she does to followup as soon as possible with her primary care physician. She agrees.  Patient will return with worsening symptoms, additional episodes of syncope, chest pain no palpitations, or other symptoms. She verbalizes understanding and agrees with plan.     MDM  Patient with upper respiratory tract infection symptoms and nausea. Her episode of syncope today is very likely vasovagal in etiology given prodrome and preceding symptoms. Do not suspect cardiac arrhythmia. No chest pain. She appears well. No significant orthostasis by vitals, hypertension results. EKG is unremarkable. She appears well and is able to follow up with her primary care physician. Strict return instructions  given.      Renne Crigler, PA 12/09/12 [address] [place] 12/12/12 [phone]

## 2012-12-09 NOTE — ED Notes (Signed)
Patient in kitchen this am feeling nauseated and took medication, patient then had syncopal episode, patient states no memory of events, patient states headache this am, patient received zofran 4 mg, Normal slaine 500 ml bolus pta, patient initial SBP per ems 80's, 120's by time of arrival.

## 2012-12-13 NOTE — ED Provider Notes (Signed)
Medical screening examination/treatment/procedure(s) were performed by non-physician practitioner and as supervising physician I was immediately available for consultation/collaboration.    Nelia Shi, MD 12/13/12 1329

## 2013-11-18 IMAGING — CT CT HEAD W/O CM
1 series · 16 of 30 positions shown, 20 images · non-contrast
Comparison: None

CLINICAL DATA: Nausea this morning.  Syncopal episode.  No memory
of the events. Headache.

CT HEAD WITHOUT CONTRAST
TECHNIQUE: Contiguous axial images were obtained from the base of
the skull through the vertex without contrast.

[Series 2: head routine 4.8 h37s · axial · 0.43mm/px · z∈[-140,-10]mm · 16 of 30 slices shown, 20 images]
[im 2/30  brain]
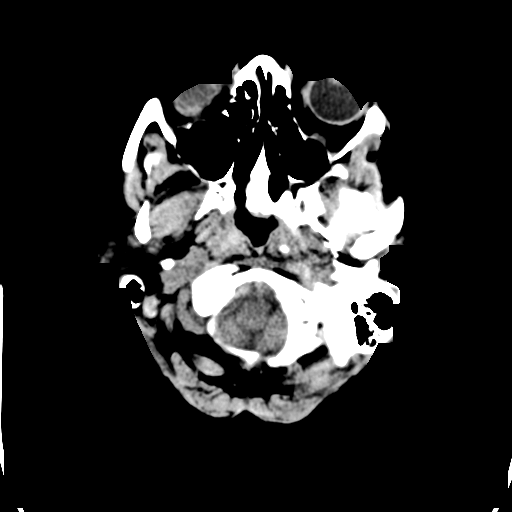
[im 2/30  bone]
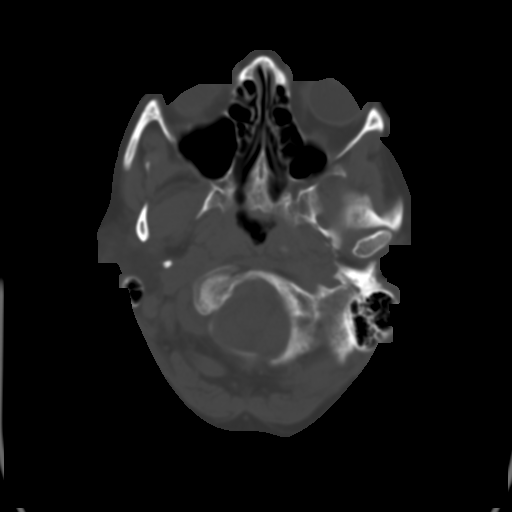
[im 4/30  brain]
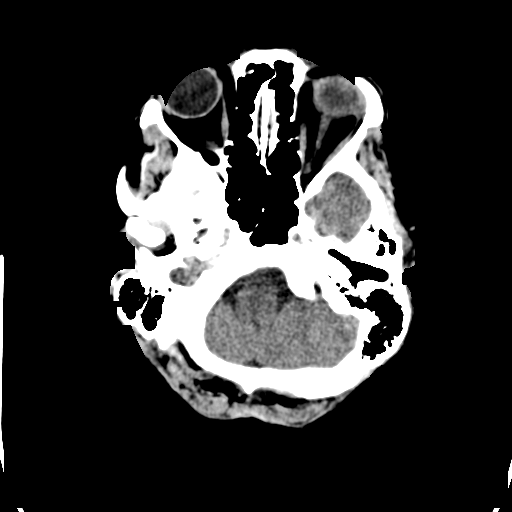
[im 6/30  brain]
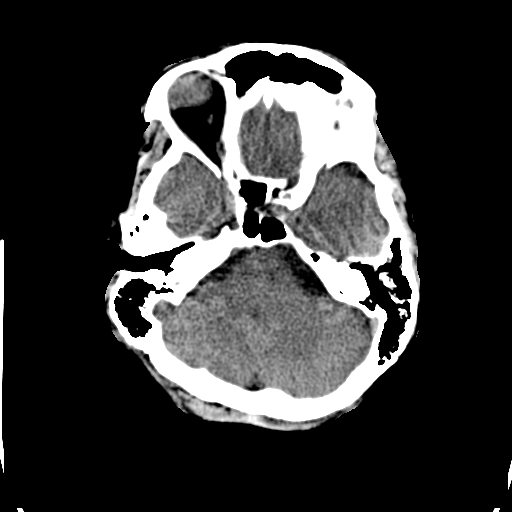
[im 8/30  brain]
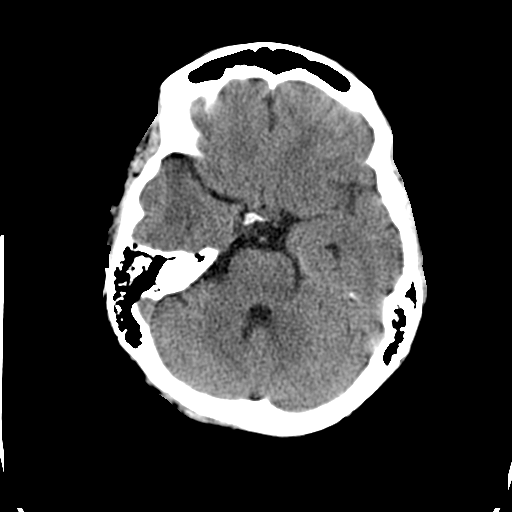
[im 9/30  brain]
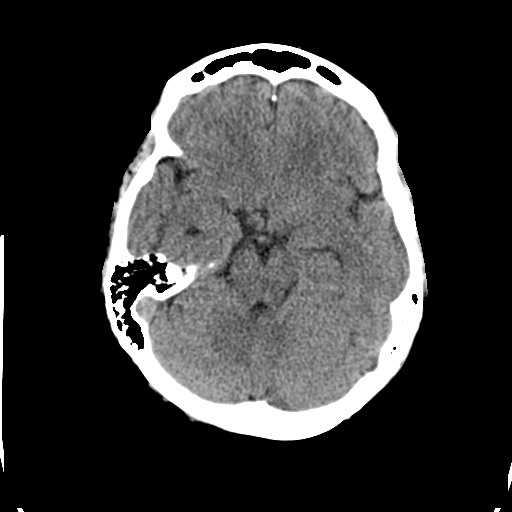
[im 9/30  bone]
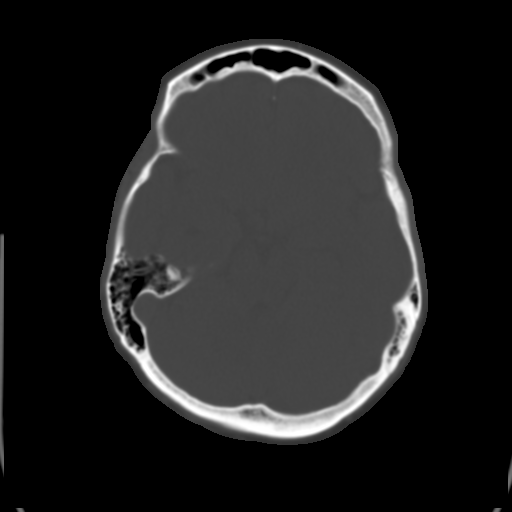
[im 11/30  brain]
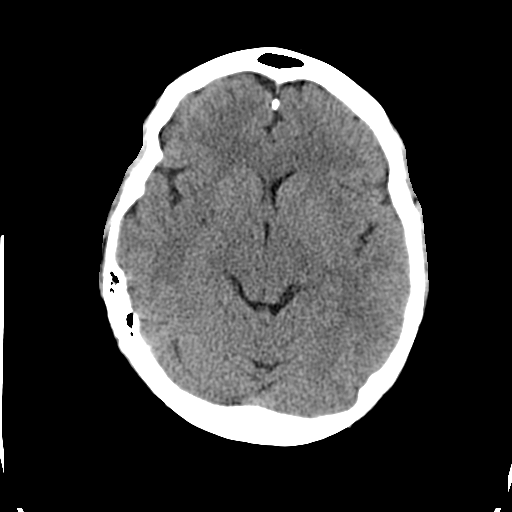
[im 13/30  brain]
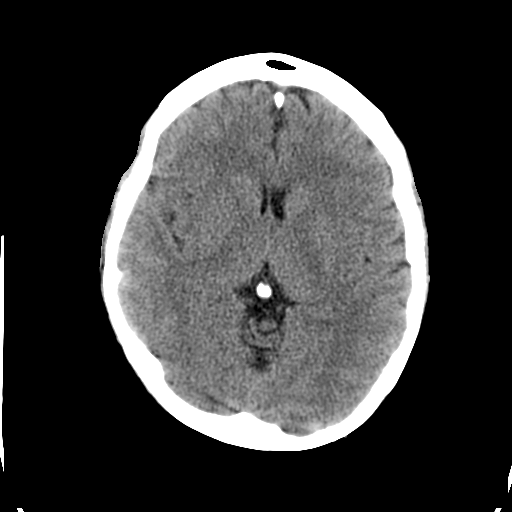
[im 15/30  brain]
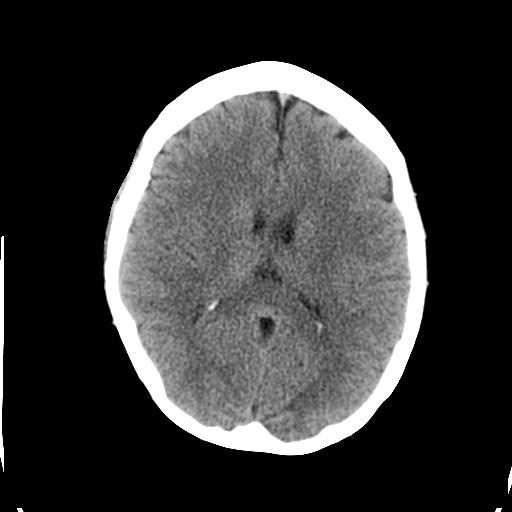
[im 16/30  brain]
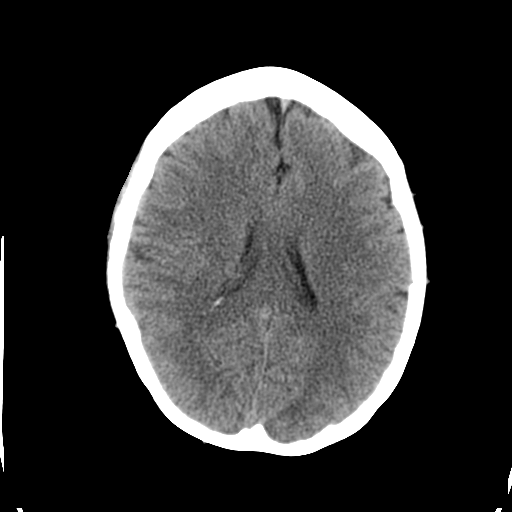
[im 16/30  bone]
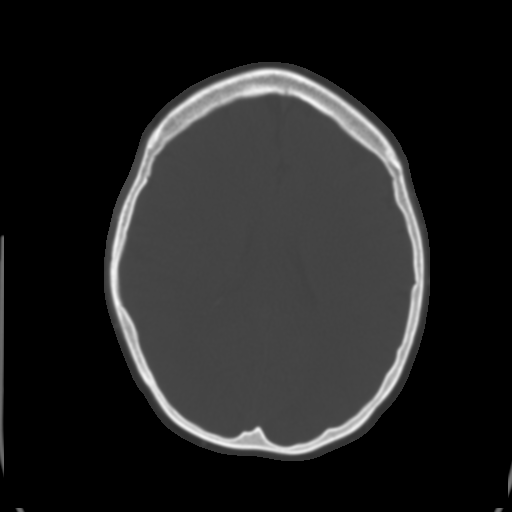
[im 18/30  brain]
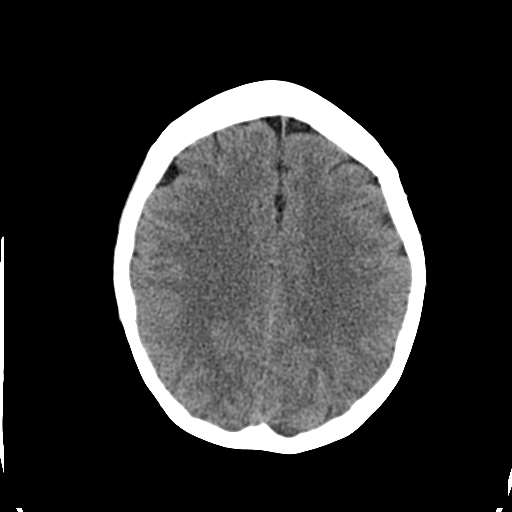
[im 20/30  brain]
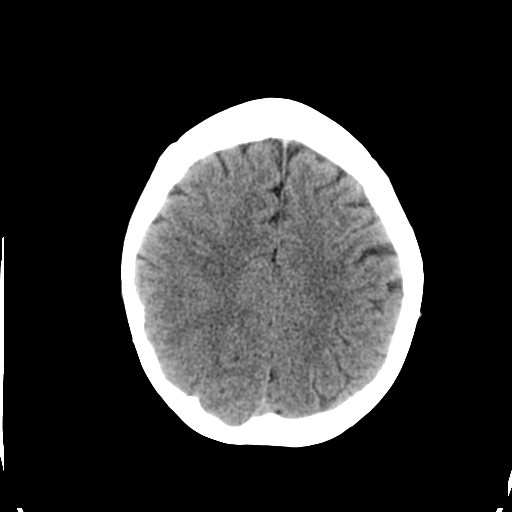
[im 22/30  brain]
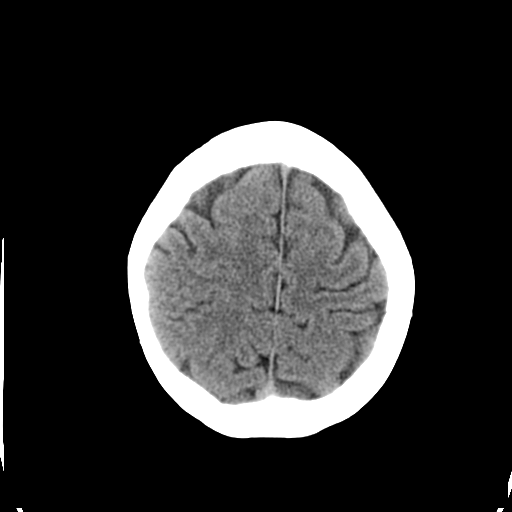
[im 23/30  brain]
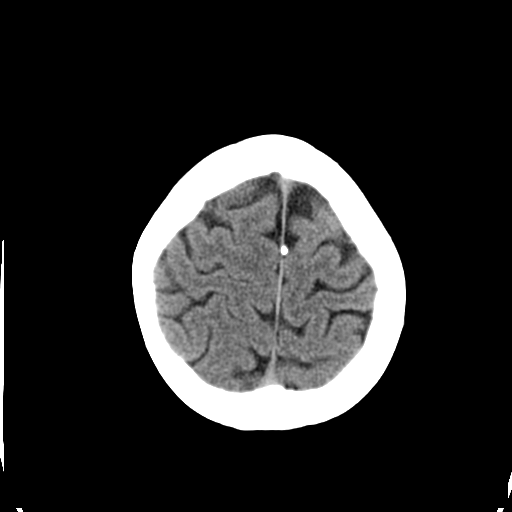
[im 23/30  bone]
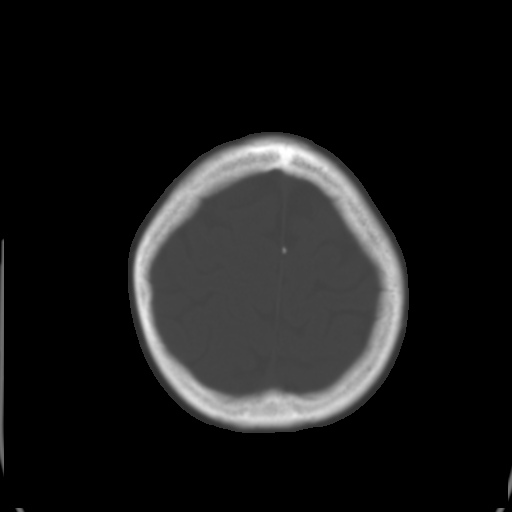
[im 25/30  brain]
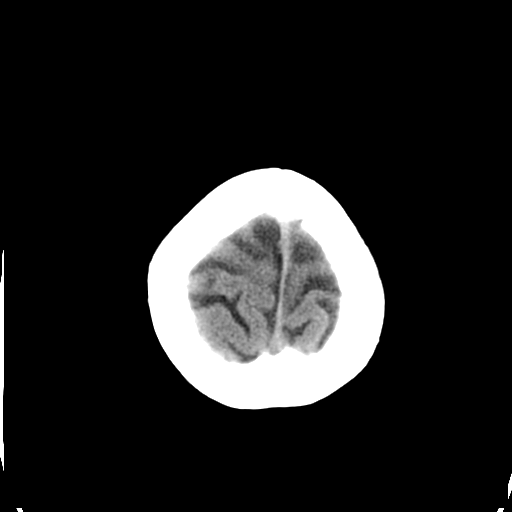
[im 27/30  brain]
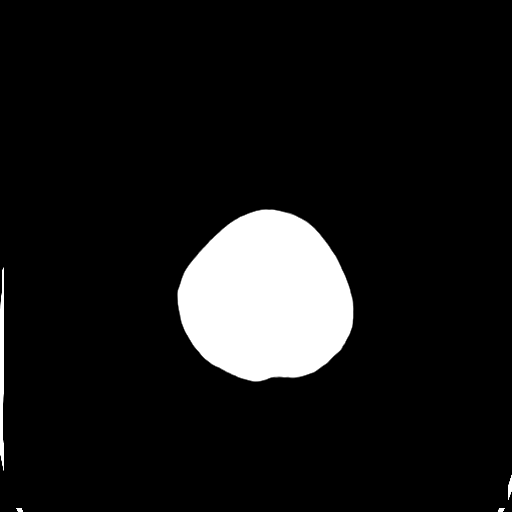
[im 29/30  brain]
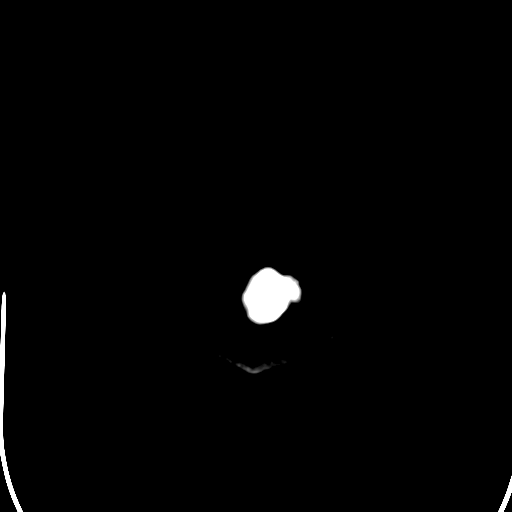

[16 of 30 positions shown; findings below may reference images not displayed]

FINDINGS: There is no intra or extra-axial fluid collection or mass
lesion.  The basilar cisterns and ventricles have a normal
appearance.  There is no CT evidence for acute infarction or
hemorrhage.

Bone windows show no acute findings.
IMPRESSION: No evidence for acute intracranial abnormality.

## 2017-07-18 DIAGNOSIS — R030 Elevated blood-pressure reading, without diagnosis of hypertension: Secondary | ICD-10-CM | POA: Diagnosis not present

## 2017-07-18 DIAGNOSIS — B029 Zoster without complications: Secondary | ICD-10-CM | POA: Diagnosis not present

## 2017-09-23 DIAGNOSIS — Z23 Encounter for immunization: Secondary | ICD-10-CM | POA: Diagnosis not present

## 2017-09-28 DIAGNOSIS — L03818 Cellulitis of other sites: Secondary | ICD-10-CM | POA: Diagnosis not present

## 2017-09-28 DIAGNOSIS — R030 Elevated blood-pressure reading, without diagnosis of hypertension: Secondary | ICD-10-CM | POA: Diagnosis not present

## 2017-11-26 DIAGNOSIS — H5203 Hypermetropia, bilateral: Secondary | ICD-10-CM | POA: Diagnosis not present

## 2017-11-26 DIAGNOSIS — H524 Presbyopia: Secondary | ICD-10-CM | POA: Diagnosis not present

## 2017-11-26 DIAGNOSIS — H52223 Regular astigmatism, bilateral: Secondary | ICD-10-CM | POA: Diagnosis not present

## 2018-08-28 DIAGNOSIS — Z23 Encounter for immunization: Secondary | ICD-10-CM | POA: Diagnosis not present

## 2018-09-03 DIAGNOSIS — J9801 Acute bronchospasm: Secondary | ICD-10-CM | POA: Diagnosis not present

## 2018-09-03 DIAGNOSIS — J329 Chronic sinusitis, unspecified: Secondary | ICD-10-CM | POA: Diagnosis not present

## 2018-09-03 DIAGNOSIS — J042 Acute laryngotracheitis: Secondary | ICD-10-CM | POA: Diagnosis not present

## 2018-09-25 ENCOUNTER — Other Ambulatory Visit (HOSPITAL_COMMUNITY): Payer: Self-pay | Admitting: Internal Medicine

## 2018-09-25 ENCOUNTER — Ambulatory Visit (HOSPITAL_COMMUNITY): Payer: Self-pay

## 2018-09-25 ENCOUNTER — Ambulatory Visit (HOSPITAL_COMMUNITY)
Admission: RE | Admit: 2018-09-25 | Discharge: 2018-09-25 | Disposition: A | Discharge status: Home / Self Care | Payer: 59 | Source: Ambulatory Visit | Attending: Internal Medicine | Admitting: Internal Medicine

## 2018-09-25 DIAGNOSIS — R059 Cough, unspecified: Secondary | ICD-10-CM

## 2018-09-25 DIAGNOSIS — R05 Cough: Secondary | ICD-10-CM

## 2018-09-25 DIAGNOSIS — R498 Other voice and resonance disorders: Secondary | ICD-10-CM | POA: Diagnosis not present

## 2018-09-25 DIAGNOSIS — R0600 Dyspnea, unspecified: Secondary | ICD-10-CM | POA: Diagnosis not present

## 2018-09-25 DIAGNOSIS — J329 Chronic sinusitis, unspecified: Secondary | ICD-10-CM | POA: Diagnosis not present

## 2022-10-24 ENCOUNTER — Emergency Department (HOSPITAL_COMMUNITY): Payer: Medicare Other

## 2022-10-24 ENCOUNTER — Observation Stay (HOSPITAL_COMMUNITY)
Admission: EM | Admit: 2022-10-24 | Discharge: 2022-10-25 | Disposition: A | Discharge status: Home / Self Care | Payer: Medicare Other | Attending: Internal Medicine | Admitting: Internal Medicine

## 2022-10-24 ENCOUNTER — Other Ambulatory Visit (HOSPITAL_COMMUNITY): Payer: Medicare Other

## 2022-10-24 ENCOUNTER — Other Ambulatory Visit: Payer: Self-pay

## 2022-10-24 DIAGNOSIS — E872 Acidosis, unspecified: Secondary | ICD-10-CM | POA: Insufficient documentation

## 2022-10-24 DIAGNOSIS — W109XXA Fall (on) (from) unspecified stairs and steps, initial encounter: Secondary | ICD-10-CM | POA: Insufficient documentation

## 2022-10-24 DIAGNOSIS — S82852A Displaced trimalleolar fracture of left lower leg, initial encounter for closed fracture: Secondary | ICD-10-CM | POA: Diagnosis not present

## 2022-10-24 DIAGNOSIS — Y92009 Unspecified place in unspecified non-institutional (private) residence as the place of occurrence of the external cause: Secondary | ICD-10-CM | POA: Insufficient documentation

## 2022-10-24 DIAGNOSIS — J45909 Unspecified asthma, uncomplicated: Secondary | ICD-10-CM | POA: Diagnosis not present

## 2022-10-24 DIAGNOSIS — Z79899 Other long term (current) drug therapy: Secondary | ICD-10-CM | POA: Diagnosis not present

## 2022-10-24 DIAGNOSIS — E876 Hypokalemia: Secondary | ICD-10-CM | POA: Insufficient documentation

## 2022-10-24 DIAGNOSIS — W19XXXA Unspecified fall, initial encounter: Secondary | ICD-10-CM

## 2022-10-24 DIAGNOSIS — Y9301 Activity, walking, marching and hiking: Secondary | ICD-10-CM | POA: Diagnosis not present

## 2022-10-24 DIAGNOSIS — S82892A Other fracture of left lower leg, initial encounter for closed fracture: Secondary | ICD-10-CM | POA: Diagnosis present

## 2022-10-24 DIAGNOSIS — R112 Nausea with vomiting, unspecified: Secondary | ICD-10-CM | POA: Insufficient documentation

## 2022-10-24 DIAGNOSIS — M25572 Pain in left ankle and joints of left foot: Secondary | ICD-10-CM | POA: Diagnosis present

## 2022-10-24 LAB — BASIC METABOLIC PANEL
Anion gap: 11 (ref 5–15)
BUN: 13 mg/dL (ref 8–23)
CO2: 21 mmol/L — ABNORMAL LOW (ref 22–32)
Calcium: 9.3 mg/dL (ref 8.9–10.3)
Chloride: 106 mmol/L (ref 98–111)
Creatinine, Ser: 1.04 mg/dL — ABNORMAL HIGH (ref 0.44–1.00)
GFR, Estimated: 60 mL/min — ABNORMAL LOW (ref >60)
Glucose, Bld: 119 mg/dL — ABNORMAL HIGH (ref 70–99)
Potassium: 3.1 mmol/L — ABNORMAL LOW (ref 3.5–5.1)
Sodium: 138 mmol/L (ref 135–145)

## 2022-10-24 LAB — CBC WITH DIFFERENTIAL/PLATELET
Abs Immature Granulocytes: 0.06 10*3/uL (ref 0.00–0.07)
Basophils Absolute: 0.1 10*3/uL (ref 0.0–0.1)
Basophils Relative: 1 %
Eosinophils Absolute: 0.1 10*3/uL (ref 0.0–0.5)
Eosinophils Relative: 1 %
HCT: 39.9 % (ref 36.0–46.0)
Hemoglobin: 13.2 g/dL (ref 12.0–15.0)
Immature Granulocytes: 1 %
Lymphocytes Relative: 26 %
Lymphs Abs: 2.9 10*3/uL (ref 0.7–4.0)
MCH: 31.2 pg (ref 26.0–34.0)
MCHC: 33.1 g/dL (ref 30.0–36.0)
MCV: 94.3 fL (ref 80.0–100.0)
Monocytes Absolute: 0.8 10*3/uL (ref 0.1–1.0)
Monocytes Relative: 7 %
Neutro Abs: 7.3 10*3/uL (ref 1.7–7.7)
Neutrophils Relative %: 64 %
Platelets: 350 10*3/uL (ref 150–400)
RBC: 4.23 MIL/uL (ref 3.87–5.11)
RDW: 12.4 % (ref 11.5–15.5)
WBC: 11.2 10*3/uL — ABNORMAL HIGH (ref 4.0–10.5)
nRBC: 0 % (ref 0.0–0.2)

## 2022-10-24 MED ORDER — ONDANSETRON HCL 4 MG/2ML IJ SOLN
4.0000 mg | Freq: Once | INTRAMUSCULAR | Status: AC
  Administered 2022-10-24: 4 mg via INTRAVENOUS
  Filled 2022-10-24: qty 2

## 2022-10-24 MED ORDER — HYDROMORPHONE HCL 1 MG/ML IJ SOLN
1.0000 mg | Freq: Once | INTRAMUSCULAR | Status: AC
  Administered 2022-10-24: 1 mg via INTRAVENOUS
  Filled 2022-10-24: qty 1

## 2022-10-24 MED ORDER — HYDROMORPHONE HCL 1 MG/ML IJ SOLN
0.5000 mg | Freq: Once | INTRAMUSCULAR | Status: AC
  Administered 2022-10-24: 0.5 mg via INTRAVENOUS
  Filled 2022-10-24: qty 1

## 2022-10-24 MED ORDER — SODIUM CHLORIDE 0.9 % IV BOLUS
500.0000 mL | Freq: Once | INTRAVENOUS | Status: AC
  Administered 2022-10-24: 500 mL via INTRAVENOUS

## 2022-10-24 MED ORDER — PROPOFOL 10 MG/ML IV BOLUS
60.0000 mg | Freq: Once | INTRAVENOUS | Status: DC
  Filled 2022-10-24: qty 20

## 2022-10-24 MED ORDER — PROPOFOL 10 MG/ML IV BOLUS
INTRAVENOUS | Status: AC | PRN
  Administered 2022-10-24: 60 mg via INTRAVENOUS

## 2022-10-24 MED ORDER — SODIUM CHLORIDE 0.9 % IV SOLN
12.5000 mg | Freq: Once | INTRAVENOUS | Status: AC
  Administered 2022-10-24: 12.5 mg via INTRAVENOUS
  Filled 2022-10-24: qty 0.5

## 2022-10-24 NOTE — Progress Notes (Signed)
Orthopedic Tech Progress Note Patient Details:  [name] 07/20/1956 [id number]  Ortho Devices Type of Ortho Device: Short leg splint Ortho Device/Splint Location: LLE Ortho Device/Splint Interventions: Ordered, Application, Adjustment  Assisted ED MD with splint. Post Interventions Patient Tolerated: Well  Al Decant 10/24/2022, 11:17 PM

## 2022-10-24 NOTE — ED Notes (Signed)
Dr Jodi Mourning discussing plan with pt at bedside

## 2022-10-24 NOTE — ED Notes (Signed)
Consent filled out and at pt bedside

## 2022-10-24 NOTE — ED Notes (Signed)
Zavitz and Ortho tech at the bedside

## 2022-10-24 NOTE — ED Provider Notes (Addendum)
[facility] EMERGENCY DEPARTMENT Provider Note   CSN: [id number] Arrival date & time: 10/24/22  1918     History  Chief Complaint  Patient presents with   Ankle Injury    Obvious deformity     [name] is a 66 y.o. female.  Patient presents with severe left ankle pain and deformity since falling while taking the trash out.  Slipped or missed the last step.  Patient caught herself with her right hand and no other significant injuries except for mild pain in the right foot.  Patient has history of neuropathy from bulging disc in the past.  Patient received 10 mg of morphine on route and mild improvement but still severe pain and Zofran 4 mg.  Last meal 530 today.  Pain with any attempted movement.       Home Medications Prior to Admission medications   Medication Sig Start Date End Date Taking? Authorizing Provider  docusate sodium (COLACE) 100 MG capsule Take 100 mg by mouth daily.    [provider]  ibuprofen (ADVIL,MOTRIN) 200 MG tablet Take 200 mg by mouth every 6 (six) hours as needed. For pain    [provider]  levofloxacin (LEVAQUIN) 500 MG tablet Take 1 tablet (500 mg total) by mouth daily. 12/09/12   Renne Crigler, PA-C  ondansetron (ZOFRAN ODT) 8 MG disintegrating tablet Take 1 tablet (8 mg total) by mouth every 8 (eight) hours as needed for nausea. 12/09/12   Renne Crigler, PA-C      Allergies    Amoxicillin-pot clavulanate and Codeine    Review of Systems   Review of Systems  Unable to perform ROS: Acuity of condition    Physical Exam Updated Vital Signs BP (!) 117/92   Pulse 77   Temp 98.5 F (36.9 C) (Oral)   Resp 16   Ht 5\' 3"  (1.6 m)   Wt 81.2 kg   SpO2 100%   BMI 31.71 kg/m  Physical Exam Vitals and nursing note reviewed.  Constitutional:      General: She is not in acute distress.    Appearance: She is well-developed.  HENT:     Head: Normocephalic and atraumatic.     Mouth/Throat:     Mouth:  Mucous membranes are moist.  Eyes:     General:        Right eye: No discharge.        Left eye: No discharge.     Conjunctiva/sclera: Conjunctivae normal.  Neck:     Trachea: No tracheal deviation.  Cardiovascular:     Rate and Rhythm: Normal rate.  Pulmonary:     Effort: Pulmonary effort is normal.  Abdominal:     General: There is no distension.     Palpations: Abdomen is soft.     Tenderness: There is no abdominal tenderness. There is no guarding.  Musculoskeletal:     Cervical back: Normal range of motion and neck supple. No rigidity.     Comments: Patient has deformity left ankle with swelling tenderness and decreased range of motion due to pain.  Mild ecchymosis and superficial abrasion.  No open wounds.  Tenderness proximal left foot and distal anterior tibia as well.  Patient has mild tenderness right proximal lateral foot with superficial abrasion.  No ankle tenderness on the right foot.  No proximal leg tenderness on the left.  Skin:    General: Skin is warm.     Capillary Refill: Capillary refill takes less than  2 seconds.     Findings: Rash present.  Neurological:     General: No focal deficit present.     Mental Status: She is alert.  Psychiatric:        Mood and Affect: Mood is anxious.     ED Results / Procedures / Treatments   Labs (all labs ordered are listed, but only abnormal results are displayed) Labs Reviewed  CBC WITH DIFFERENTIAL/PLATELET - Abnormal; Notable for the following components:      Result Value   WBC 11.2 (*)    All other components within normal limits  BASIC METABOLIC PANEL - Abnormal; Notable for the following components:   Potassium 3.1 (*)    CO2 21 (*)    Glucose, Bld 119 (*)    Creatinine, Ser 1.04 (*)    GFR, Estimated 60 (*)    All other components within normal limits    EKG None  Radiology DG Ankle Left Port  Result Date: 10/24/2022 CLINICAL DATA:  Overlying cast obscures fine osseous details. Mildly displaced  comminuted fracture of the distal fibula. Mildly displaced fracture of the posterior malleolus EXAM: PORTABLE LEFT ANKLE - 2 VIEW COMPARISON:  None Available. FINDINGS: Status post reduction and casting radiographs demonstrate significant improvement in the tibiotalar joint dislocation. Improved alignment of the distal fibular comminuted fracture. There is also improvement of the alignment of the posterior malleolar fracture. Soft tissue swelling about the ankle. IMPRESSION: Status post reduction and casting radiographs demonstrate significant improvement in the tibiotalar joint dislocation. As well as improved alignment of the fibular and posterior malleolar fractures. Electronically Signed   By: Larose Hires D.O.   On: 10/24/2022 23:20   DG Tibia/Fibula Left Port  Result Date: 10/24/2022 CLINICAL DATA:  Fall.  Left lower leg pain and deformity. EXAM: PORTABLE LEFT TIBIA AND FIBULA - 2 VIEW COMPARISON:  Left ankle radiographs also obtained today FINDINGS: Oblique fracture of the distal fibular metadiaphysis is seen with moderate dorsal angulation of the distal fracture fragment. Fractures are also seen involving the medial and posterior malleoli of the distal tibia. Dorsal dislocation of the talus is also seen. IMPRESSION: Trimalleolar ankle fracture-dislocation, which includes fracture of the distal fibular metadiaphysis. Electronically Signed   By: Danae Orleans M.D.   On: 10/24/2022 22:10   DG Ankle Complete Right  Result Date: 10/24/2022 CLINICAL DATA:  Fall.  Right ankle pain. EXAM: RIGHT ANKLE - COMPLETE 3+ VIEW COMPARISON:  None Available. FINDINGS: There is no evidence of fracture or dislocation. Probable ankle joint effusion noted on the lateral view. There is no evidence of arthropathy. A small plantar calcaneal bone spur is noted. Soft tissues are unremarkable. IMPRESSION: Probable ankle joint effusion. No evidence of fracture or dislocation. Electronically Signed   By: Danae Orleans M.D.   On:  10/24/2022 22:08   DG Ankle Left Port  Result Date: 10/24/2022 CLINICAL DATA:  Fall.  Left ankle pain and deformity. EXAM: PORTABLE LEFT ANKLE - 2 VIEW COMPARISON:  None Available. FINDINGS: Displaced fractures are seen involving the medial and posterior malleoli of the distal tibia. Oblique fractures also seen involving the distal fibular metadiaphysis, above the level of the tibial plafond, which shows moderate dorsal angulation. Posterior dislocation of the talus is also seen. IMPRESSION: Left ankle fracture-dislocation, as described above. Electronically Signed   By: Danae Orleans M.D.   On: 10/24/2022 22:07    Procedures Reduction of dislocation  Date/Time: 10/24/2022 11:23 PM  Performed by: Blane Ohara, MD Authorized by: Jodi Mourning,  Ivin Booty, MD  Consent: Verbal consent obtained. Written consent obtained. Risks and benefits: risks, benefits and alternatives were discussed Consent given by: patient Patient understanding: patient states understanding of the procedure being performed Patient consent: the patient's understanding of the procedure matches consent given Patient identity confirmed: arm band and verbally with patient Time out: Immediately prior to procedure a "time out" was called to verify the correct patient, procedure, equipment, support staff and site/side marked as required. Preparation: Patient was prepped and draped in the usual sterile fashion.  Sedation: Patient sedated: yes Sedatives: propofol Sedation start date/time: 10/24/2022 11:00 PM Sedation end date/time: 10/24/2022 11:20 PM Vitals: Vital signs were monitored during sedation.  Patient tolerance: patient tolerated the procedure well with no immediate complications       Medications Ordered in ED Medications  propofol (DIPRIVAN) 10 mg/mL bolus/IV push 60 mg (60 mg Intravenous See Procedure Record 10/24/22 2314)  propofol (DIPRIVAN) 10 mg/mL bolus/IV push (60 mg Intravenous Given 10/24/22 2203)   HYDROmorphone (DILAUDID) injection 1 mg (1 mg Intravenous Given 10/24/22 1958)  ondansetron (ZOFRAN) injection 4 mg (4 mg Intravenous Given 10/24/22 2022)  sodium chloride 0.9 % bolus 500 mL (0 mLs Intravenous Stopped 10/24/22 2225)  HYDROmorphone (DILAUDID) injection 0.5 mg (0.5 mg Intravenous Given 10/24/22 2253)  sodium chloride 0.9 % bolus 500 mL (500 mLs Intravenous New Bag/Given 10/24/22 2232)  promethazine (PHENERGAN) 12.5 mg in sodium chloride 0.9 % 50 mL IVPB (0 mg Intravenous Stopped 10/24/22 2252)    ED Course/ Medical Decision Making/ A&P                           Medical Decision Making Amount and/or Complexity of Data Reviewed Labs: ordered. Radiology: ordered.  Risk Prescription drug management.   Patient presents after mechanical fall partially due to neuropathy and missing a step leading to clinical concern for left ankle unstable fracture versus dislocation/fracture.  Plan for x-rays, n.p.o., general blood work and reassessment.  Pain meds ordered.  Due to ER being extremely busy delay in getting x-rays.  X-rays reviewed at bedside showing trimalleolar fracture and dislocation.  Discussed with orthopedic on-call Dr. Sherilyn Dacosta, no OR available at this time recommended myself to reduce and posterior flap splint with stirrup.  Consent will be obtained, propofol be used for sedation, pain meds already given.   General blood work ordered mild white blood cell count elevation 11 likely secondary stress response, normal hemoglobin, electrolytes overall unremarkable potassium mild low 3.1.  Sedation using propofol and reduction performed by myself with resident physician assistants.  Patient overall tolerated well with mild desaturation that resolved with nasal cannula.  Patient gradually improved and being monitored.  Splint placed by orthopedic technician.  Discussed with patient's significant other family and if her pain is controlled no vomiting he would like to try to take her  home and follow-up in the office.  Plan for CT ankle and reassessment.  Repeat x-ray postreduction significant improved alignment, fracture still visualized.      Final Clinical Impression(s) / ED Diagnoses Final diagnoses:  Fall, initial encounter  Closed fracture of left ankle, initial encounter    Rx / DC Orders ED Discharge Orders     None         Blane Ohara, MD 10/24/22 4497    Blane Ohara, MD 10/24/22 2329

## 2022-10-24 NOTE — ED Notes (Signed)
Pulse heard in LLE with doppler

## 2022-10-24 NOTE — ED Notes (Signed)
Additional propofol wasted in stericyle bin with [employer]

## 2022-10-24 NOTE — Progress Notes (Signed)
Patient discussed with EDP.  Imaging reviewed.  L trimalleolar ankle fx dislocation, with talus dislocated posteriorly.  Recommendations: Closed reduction under ankle block or sedation Translate talus anteriorly (e.g. cup the heel or pull distally on toes/forefoot while pushing posteriorly on distal anterior tibia Maintain plantar flexed position throughout and afterwards as this will be more stable than neutral ankle position Splint with long AO splint (posterior slab + stirrups) Obtain post-reduction XR followed by CT Instruct patient to keep this elevated higher than hear as much as possible as this will continue to swell NWB LLE F/u within 1 week  Ernestina Columbia M.D. Orthopaedic Surgery [facility]

## 2022-10-24 NOTE — ED Triage Notes (Signed)
Pt from home, was taking trash out. Slipped and missed last step, falling forward. Catching self with R hand. Pt denies pain in R arm, 10/10 pain in LLE. No pulse noted by this RN and erica RN.   10mg  morphine & 4mg  zofran given in route.

## 2022-10-25 ENCOUNTER — Emergency Department (HOSPITAL_COMMUNITY): Payer: Medicare Other

## 2022-10-25 DIAGNOSIS — S82892A Other fracture of left lower leg, initial encounter for closed fracture: Secondary | ICD-10-CM | POA: Diagnosis present

## 2022-10-25 DIAGNOSIS — R112 Nausea with vomiting, unspecified: Secondary | ICD-10-CM

## 2022-10-25 LAB — BASIC METABOLIC PANEL
Anion gap: 13 (ref 5–15)
BUN: 9 mg/dL (ref 8–23)
CO2: 21 mmol/L — ABNORMAL LOW (ref 22–32)
Calcium: 8.7 mg/dL — ABNORMAL LOW (ref 8.9–10.3)
Chloride: 105 mmol/L (ref 98–111)
Creatinine, Ser: 0.95 mg/dL (ref 0.44–1.00)
GFR, Estimated: >60 mL/min (ref >60)
Glucose, Bld: 139 mg/dL — ABNORMAL HIGH (ref 70–99)
Potassium: 3.7 mmol/L (ref 3.5–5.1)
Sodium: 139 mmol/L (ref 135–145)

## 2022-10-25 LAB — CBC
HCT: 38.1 % (ref 36.0–46.0)
Hemoglobin: 12.8 g/dL (ref 12.0–15.0)
MCH: 31.4 pg (ref 26.0–34.0)
MCHC: 33.6 g/dL (ref 30.0–36.0)
MCV: 93.4 fL (ref 80.0–100.0)
Platelets: 314 10*3/uL (ref 150–400)
RBC: 4.08 MIL/uL (ref 3.87–5.11)
RDW: 12.4 % (ref 11.5–15.5)
WBC: 13.6 10*3/uL — ABNORMAL HIGH (ref 4.0–10.5)
nRBC: 0 % (ref 0.0–0.2)

## 2022-10-25 LAB — HIV ANTIBODY (ROUTINE TESTING W REFLEX): HIV Screen 4th Generation wRfx: NONREACTIVE

## 2022-10-25 LAB — CREATININE, SERUM
Creatinine, Ser: 0.94 mg/dL (ref 0.44–1.00)
GFR, Estimated: >60 mL/min (ref >60)

## 2022-10-25 MED ORDER — ONDANSETRON HCL 4 MG/2ML IJ SOLN
4.0000 mg | Freq: Once | INTRAMUSCULAR | Status: AC
  Administered 2022-10-25: 4 mg via INTRAVENOUS
  Filled 2022-10-25: qty 2

## 2022-10-25 MED ORDER — OXYCODONE-ACETAMINOPHEN 5-325 MG PO TABS: 1.0000 | ORAL_TABLET | Freq: Four times a day (QID) | ORAL | 0 refills | Status: AC | PRN

## 2022-10-25 MED ORDER — POTASSIUM CHLORIDE 2 MEQ/ML IV SOLN
INTRAVENOUS | Status: DC
  Filled 2022-10-25 (×2): qty 1000

## 2022-10-25 MED ORDER — ONDANSETRON 4 MG PO TBDP: ORAL_TABLET | ORAL | 0 refills | Status: DC

## 2022-10-25 MED ORDER — PROMETHAZINE HCL 25 MG PO TABS: 25.0000 mg | ORAL_TABLET | Freq: Four times a day (QID) | ORAL | 0 refills | Status: AC | PRN

## 2022-10-25 MED ORDER — MELATONIN 5 MG PO TABS: 5.0000 mg | ORAL_TABLET | Freq: Every evening | ORAL | Status: DC | PRN

## 2022-10-25 MED ORDER — PROCHLORPERAZINE EDISYLATE 10 MG/2ML IJ SOLN: 5.0000 mg | Freq: Four times a day (QID) | INTRAMUSCULAR | Status: DC | PRN

## 2022-10-25 MED ORDER — HYDROMORPHONE HCL 1 MG/ML IJ SOLN: 0.5000 mg | INTRAMUSCULAR | Status: DC | PRN

## 2022-10-25 MED ORDER — ACETAMINOPHEN 325 MG PO TABS: 650.0000 mg | ORAL_TABLET | Freq: Four times a day (QID) | ORAL | Status: DC | PRN

## 2022-10-25 MED ORDER — ENOXAPARIN SODIUM 40 MG/0.4ML IJ SOSY: 40.0000 mg | PREFILLED_SYRINGE | Freq: Every day | INTRAMUSCULAR | Status: DC

## 2022-10-25 MED ORDER — OXYCODONE HCL 5 MG PO TABS
5.0000 mg | ORAL_TABLET | Freq: Four times a day (QID) | ORAL | Status: DC | PRN
  Administered 2022-10-25: 5 mg via ORAL
  Filled 2022-10-25: qty 1

## 2022-10-25 MED ORDER — POLYETHYLENE GLYCOL 3350 17 G PO PACK: 17.0000 g | PACK | Freq: Every day | ORAL | Status: DC | PRN

## 2022-10-25 MED ORDER — PROMETHAZINE HCL 25 MG RE SUPP: 25.0000 mg | Freq: Four times a day (QID) | RECTAL | 0 refills | Status: AC | PRN

## 2022-10-25 MED ORDER — OXYCODONE-ACETAMINOPHEN 5-325 MG PO TABS: 1.0000 | ORAL_TABLET | Freq: Four times a day (QID) | ORAL | 0 refills | Status: DC | PRN

## 2022-10-25 NOTE — ED Notes (Signed)
Paged ortho for crutches 

## 2022-10-25 NOTE — Discharge Summary (Signed)
Physician Discharge Summary  [name] [id number] DOB: 10-Jan-1956 DOA: 10/24/2022  PCP: Redmond School, MD  Admit date: 10/24/2022 Discharge date: 10/25/2022  Admitted From: home Discharge disposition: home   Recommendations for Outpatient Follow-Up:   Ortho follow up-- NWB LE PRN pain and nausea meds Keep leg elevated   Discharge Diagnosis:   Principal Problem:   Closed left ankle fracture    Discharge Condition: Improved.  Diet recommendation:  Regular.  Wound care: None.  Code status: Full.   History of Present Illness:   [name] is a 66 y.o. female with medical history significant for obesity, who presented to [facility] ED from home after a mechanical fall.  She was walking down the stairs to take her trash out.  She missed and fell off the last step, tried to catch herself with her right hand, but landed in her yard.  She immediately knew something was wrong with her left ankle due to the severe pain and dislocation.   In the ED, EDP discussed the case with orthopedic surgery.  The patient had her ankle reduced in the ED.  She will follow-up with orthopedic surgery within 1 week.  No weightbearing to left lower extremity.  Elevate left lower extremity.  She received IV opioids analgesics and became nauseous.  She received IV antiemetics.  Her nausea persisted.  Due to intractable nausea and vomiting EDP recommended admission for observation.  The patient was admitted by [facility], hospitalist service.   Hospital Course by Problem:   Closed left ankle fracture, post mechanical fall, POA Pain control in place and IV antiemetics Management per orthopedic surgery's recommendation No weightbearing, elevate left lower extremity Follow-up with orthopedic surgery within 1 week.   N/V -resolved -PRN phenergan- per patient zofran does not work  Obesity BMI 31 Recommend weight loss outpatient with regular physical activity and healthy dieting.    Hypokalemia -repleted.      Medical Consultants:   ortho   Discharge Exam:   Vitals:   10/25/22 0730 10/25/22 0736  BP: 126/69   Pulse: 69   Resp: 14   Temp:    SpO2: 100% 98%   Vitals:   10/25/22 0608 10/25/22 0615 10/25/22 0730 10/25/22 0736  BP:  124/66 126/69   Pulse:  61 69   Resp:  17 14   Temp: 98.6 F (37 C)     TempSrc: Oral     SpO2:  97% 100% 98%  Weight:      Height:        General exam: Appears calm and comfortable.    The results of significant diagnostics from this hospitalization (including imaging, microbiology, ancillary and laboratory) are listed below for reference.     Procedures and Diagnostic Studies:   CT Ankle Left Wo Contrast  Result Date: 10/25/2022 CLINICAL DATA:  Left ankle fracture post reduction. EXAM: CT OF THE LEFT ANKLE WITHOUT CONTRAST TECHNIQUE: Multidetector CT imaging of the left ankle was performed according to the standard protocol. Multiplanar CT image reconstructions were also generated. RADIATION DOSE REDUCTION: This exam was performed according to the departmental dose-optimization program which includes automated exposure control, adjustment of the mA and/or kV according to patient size and/or use of iterative reconstruction technique. COMPARISON:  Earlier study yesterday prior to and following reduction. FINDINGS: Bones/Joint/Cartilage The posterior talar dislocation noted previously has been reduced. Mild osteopenia. There is a transverse medial malleolar fracture, predominantly at the level of the talar dome, slightly distracted and  otherwise not significantly displaced with small comminution fragments posteriorly which were also nondisplaced. There is oblique intra-articular fracture along the lateral aspect of the tibial plafond with mild distraction up to 3 mm and with mild lateral rotation of the fracture fragment. There is a distal fibular shaft fracture, with the distal fragment having slight posterior angulation,  slight medial angulation and 1/3 of a shaft width of lateral displacement of the distal fragment with additional evidence of chronic fracture nonunion of the posterior aspect of the lateral malleolar tip. There is a longitudinal, intra-articular mildly comminuted fracture extending broadly across the posterior malleolus, with slight posterior and superior distraction, with the fracture line intersecting with the posterior aspect of the medial malleolar fracture line. The talar dome contour is preserved. There is a small os trigonum posteriorly. There is a noninflammatory plantar calcaneal spur. There are no further fractures. There is slight widening along the anterior and medial mortise, narrowing along the lateral mortise, consistent with ligamentous trauma. Ligaments Suboptimally assessed by CT. Muscles and Tendons There is normal muscle bulk within the scanned volume. No intramuscular hematoma is seen. The tendons are not well evaluated with this technique but they appear grossly intact. The tendon laceration or strain or might be visible on CT. Soft tissues There is diffuse edema. No hematoma. No soft tissue gas. No mass is seen. IMPRESSION: 1. Interval reduction of the posterior talar dislocation. 2. Medial and posterior malleolar fractures with comminution, with slight widening along the anterior and medial mortise and narrowing along the lateral mortise consistent with ligamentous trauma, with distal fibular shaft fracture as described above. 3. Additional fracture fragment involving the lateral aspect of the tibial plafond with a slight rotation of the fracture fragment. 4. Osteopenia. 5. Diffuse soft tissue edema. 6. Plantar calcaneal spur. Electronically Signed   By: Almira Bar M.D.   On: 10/25/2022 00:59   DG Ankle Left Port  Result Date: 10/24/2022 CLINICAL DATA:  Overlying cast obscures fine osseous details. Mildly displaced comminuted fracture of the distal fibula. Mildly displaced fracture  of the posterior malleolus EXAM: PORTABLE LEFT ANKLE - 2 VIEW COMPARISON:  None Available. FINDINGS: Status post reduction and casting radiographs demonstrate significant improvement in the tibiotalar joint dislocation. Improved alignment of the distal fibular comminuted fracture. There is also improvement of the alignment of the posterior malleolar fracture. Soft tissue swelling about the ankle. IMPRESSION: Status post reduction and casting radiographs demonstrate significant improvement in the tibiotalar joint dislocation. As well as improved alignment of the fibular and posterior malleolar fractures. Electronically Signed   By: Larose Hires D.O.   On: 10/24/2022 23:20   DG Tibia/Fibula Left Port  Result Date: 10/24/2022 CLINICAL DATA:  Fall.  Left lower leg pain and deformity. EXAM: PORTABLE LEFT TIBIA AND FIBULA - 2 VIEW COMPARISON:  Left ankle radiographs also obtained today FINDINGS: Oblique fracture of the distal fibular metadiaphysis is seen with moderate dorsal angulation of the distal fracture fragment. Fractures are also seen involving the medial and posterior malleoli of the distal tibia. Dorsal dislocation of the talus is also seen. IMPRESSION: Trimalleolar ankle fracture-dislocation, which includes fracture of the distal fibular metadiaphysis. Electronically Signed   By: Danae Orleans M.D.   On: 10/24/2022 22:10   DG Ankle Complete Right  Result Date: 10/24/2022 CLINICAL DATA:  Fall.  Right ankle pain. EXAM: RIGHT ANKLE - COMPLETE 3+ VIEW COMPARISON:  None Available. FINDINGS: There is no evidence of fracture or dislocation. Probable ankle joint effusion noted on the lateral  view. There is no evidence of arthropathy. A small plantar calcaneal bone spur is noted. Soft tissues are unremarkable. IMPRESSION: Probable ankle joint effusion. No evidence of fracture or dislocation. Electronically Signed   By: Danae Orleans M.D.   On: 10/24/2022 22:08   DG Ankle Left Port  Result Date:  10/24/2022 CLINICAL DATA:  Fall.  Left ankle pain and deformity. EXAM: PORTABLE LEFT ANKLE - 2 VIEW COMPARISON:  None Available. FINDINGS: Displaced fractures are seen involving the medial and posterior malleoli of the distal tibia. Oblique fractures also seen involving the distal fibular metadiaphysis, above the level of the tibial plafond, which shows moderate dorsal angulation. Posterior dislocation of the talus is also seen. IMPRESSION: Left ankle fracture-dislocation, as described above. Electronically Signed   By: Danae Orleans M.D.   On: 10/24/2022 22:07     Labs:   Basic Metabolic Panel: Recent Labs  Lab 10/24/22 1933 10/25/22 0256  NA 138 139  K 3.1* 3.7  CL 106 105  CO2 21* 21*  GLUCOSE 119* 139*  BUN 13 9  CREATININE 1.04* 0.95  0.94  CALCIUM 9.3 8.7*   GFR Estimated Creatinine Clearance: 60.2 mL/min (by C-G formula based on SCr of 0.94 mg/dL). Liver Function Tests: No results for input(s): "AST", "ALT", "ALKPHOS", "BILITOT", "PROT", "ALBUMIN" in the last 168 hours. No results for input(s): "LIPASE", "AMYLASE" in the last 168 hours. No results for input(s): "AMMONIA" in the last 168 hours. Coagulation profile No results for input(s): "INR", "PROTIME" in the last 168 hours.  CBC: Recent Labs  Lab 10/24/22 1933 10/25/22 0256  WBC 11.2* 13.6*  NEUTROABS 7.3  --   HGB 13.2 12.8  HCT 39.9 38.1  MCV 94.3 93.4  PLT 350 314   Cardiac Enzymes: No results for input(s): "CKTOTAL", "CKMB", "CKMBINDEX", "TROPONINI" in the last 168 hours. BNP: Invalid input(s): "POCBNP" CBG: No results for input(s): "GLUCAP" in the last 168 hours. D-Dimer No results for input(s): "DDIMER" in the last 72 hours. Hgb A1c No results for input(s): "HGBA1C" in the last 72 hours. Lipid Profile No results for input(s): "CHOL", "HDL", "LDLCALC", "TRIG", "CHOLHDL", "LDLDIRECT" in the last 72 hours. Thyroid function studies No results for input(s): "TSH", "T4TOTAL", "T3FREE", "THYROIDAB" in  the last 72 hours.  Invalid input(s): "FREET3" Anemia work up No results for input(s): "VITAMINB12", "FOLATE", "FERRITIN", "TIBC", "IRON", "RETICCTPCT" in the last 72 hours. Microbiology No results found for this or any previous visit (from the past 240 hour(s)).   Discharge Instructions:   Discharge Instructions     Diet general   Complete by: As directed    Increase activity slowly   Complete by: As directed       Allergies as of 10/25/2022       Reactions   Amoxicillin-pot Clavulanate Diarrhea   Codeine Nausea And Vomiting        Medication List     STOP taking these medications    levofloxacin 500 MG tablet Commonly known as: LEVAQUIN   ondansetron 8 MG disintegrating tablet Commonly known as: Zofran ODT       TAKE these medications    acetaminophen 500 MG tablet Commonly known as: TYLENOL Take 1,000 mg by mouth every 6 (six) hours as needed for moderate pain.   docusate sodium 100 MG capsule Commonly known as: COLACE Take 100 mg by mouth daily.   ibuprofen 200 MG tablet Commonly known as: ADVIL Take 200 mg by mouth every 6 (six) hours as needed. For pain  oxyCODONE-acetaminophen 5-325 MG tablet Commonly known as: PERCOCET/ROXICET Take 1-2 tablets by mouth every 6 (six) hours as needed for severe pain.   promethazine 25 MG tablet Commonly known as: PHENERGAN Take 1 tablet (25 mg total) by mouth every 6 (six) hours as needed for nausea or vomiting.   promethazine 25 MG suppository Commonly known as: PHENERGAN Place 1 suppository (25 mg total) rectally every 6 (six) hours as needed for refractory nausea / vomiting.               Durable Medical Equipment  (From admission, onward)           Start     Ordered   10/25/22 1057  For home use only DME Walker rolling  Once       Question Answer Comment  Walker: With 5 Inch Wheels   Patient needs a walker to treat with the following condition Gait instability      10/25/22 1057    10/25/22 0953  For home use only DME Walker rolling  Once       Question Answer Comment  Walker: With 5 Inch Wheels   Patient needs a walker to treat with the following condition Ankle fracture      10/25/22 0953            Follow-up Information     Georgeanna Harrison, MD. Schedule an appointment as soon as possible for a visit .   Specialty: Orthopedic Surgery Contact information: [address] [place] [address] [phone]         Redmond School, MD Follow up in 1 week(s).   Specialty: Internal Medicine Contact information: [address] [place] [id number] [phone]                  Time coordinating discharge: 45 min  Signed:  Geradine Girt DO  [facility] 10/25/2022, 11:04 AM

## 2022-10-25 NOTE — Discharge Planning (Signed)
Oletta Cohn, RN, BSN, [place] [phone] Pt qualifies for DME [facility]) rolling walker.  DME  ordered through [facility].  Lucresia of [facility] notified to deliver DME to pt room prior to D/C home.

## 2022-10-25 NOTE — Evaluation (Signed)
Occupational Therapy Evaluation Patient Details Name: [name] MRN: [id number] DOB: 07-18-1956 Today's Date: 10/25/2022   History of Present Illness Pt is a 66 y/o female who presented after a fall at home resulting in L ankle dislocation, medial and posterior malleolar fx. Ankle reduced in ED, splint applied, nonoperative mgmt until swelling decreases. PMH: asthma, back sx   Clinical Impression   PTA, pt lives with spouse, typically Independent in all daily tasks without need for AD. Pt presents now with expected balance and endurance deficits w/ NWB LLE status. Overall, pt mobility improved with continued activity. Pt requires min guard for transfers and short mobility with RW. Pt requires Setup for UB ADLs, Min guard to Min A for LB ADLs. Pt reports spouse can assist with ADLs/mobility as needed. Pt with some difficulty managing stairs due to chronic R ankle weakness - defer further stair training to PT. Anticipate no OT needs at DC.       Recommendations for follow up therapy are one component of a multi-disciplinary discharge planning process, led by the attending physician.  Recommendations may be updated based on patient status, additional functional criteria and insurance authorization.   Follow Up Recommendations  No OT follow up     Assistance Recommended at Discharge Intermittent Supervision/Assistance  Patient can return home with the following A little help with walking and/or transfers;A little help with bathing/dressing/bathroom;Assistance with cooking/housework;Assist for transportation;Help with stairs or ramp for entrance    Functional Status Assessment  Patient has had a recent decline in their functional status and demonstrates the ability to make significant improvements in function in a reasonable and predictable amount of time.  Equipment Recommendations  BSC/3in1;Wheelchair (measurements OT);Wheelchair cushion (measurements OT);Other (comment) (RW; pt may  decline BSC and may find wheelchair with elevating leg rests outside of hospital)    Recommendations for Other Services       Precautions / Restrictions Precautions Precautions: Fall Required Braces or Orthoses: Splint/Cast Splint/Cast: L ankle Restrictions Weight Bearing Restrictions: Yes LLE Weight Bearing: Non weight bearing      Mobility Bed Mobility Overal bed mobility: Modified Independent                  Transfers Overall transfer level: Needs assistance Equipment used: Rolling walker (2 wheels) Transfers: Sit to/from Stand, Bed to chair/wheelchair/BSC Sit to Stand: Min guard     Step pivot transfers: Min guard     General transfer comment: multiple transfers to/from transport chair and toilet with minor cues for sequencing      Balance Overall balance assessment: Needs assistance Sitting-balance support: No upper extremity supported, Feet supported Sitting balance-Leahy Scale: Good     Standing balance support: No upper extremity supported, During functional activity, Bilateral upper extremity supported Standing balance-Leahy Scale: Poor Standing balance comment: reliant on at least one UE support due to NWB status                           ADL either performed or assessed with clinical judgement   ADL Overall ADL's : Needs assistance/impaired Eating/Feeding: Independent   Grooming: Set up;Standing;Wash/dry hands Grooming Details (indicate cue type and reason): standing at sink Upper Body Bathing: Set up   Lower Body Bathing: Sitting/lateral leans;Sit to/from stand;Min guard   Upper Body Dressing : Set up   Lower Body Dressing: Minimal assistance;Sit to/from stand   Toilet Transfer: Min guard;Stand-pivot;Rolling walker (2 wheels);Grab bars Toilet Transfer Details (indicate cue type and  reason): did use grab bars due to lower toilet height Toileting- Clothing Manipulation and Hygiene: Min guard;Sit to/from stand;Sitting/lateral  lean Toileting - Clothing Manipulation Details (indicate cue type and reason): cued for alternating hand support with good carryover     Functional mobility during ADLs: Min guard;Rolling walker (2 wheels) General ADL Comments: Emphasis on compensatory strategies, DME needs and problem solving safety techniques at home     Vision Baseline Vision/History: 1 Wears glasses Ability to See in Adequate Light: 0 Adequate Patient Visual Report: No change from baseline Vision Assessment?: No apparent visual deficits     Perception     Praxis      Pertinent Vitals/Pain Pain Assessment Pain Assessment: Faces Faces Pain Scale: Hurts little more Pain Location: L ankle Pain Descriptors / Indicators: Tightness Pain Intervention(s): Monitored during session, Limited activity within patient's tolerance     Hand Dominance Right   Extremity/Trunk Assessment Upper Extremity Assessment Upper Extremity Assessment: Overall WFL for tasks assessed   Lower Extremity Assessment Lower Extremity Assessment: Defer to PT evaluation   Cervical / Trunk Assessment Cervical / Trunk Assessment: Normal   Communication Communication Communication: No difficulties   Cognition Arousal/Alertness: Awake/alert Behavior During Therapy: WFL for tasks assessed/performed Overall Cognitive Status: Within Functional Limits for tasks assessed                                       General Comments       Exercises     Shoulder Instructions      Home Living Family/patient expects to be discharged to:: Private residence Living Arrangements: Spouse/significant other Available Help at Discharge: Family;Available 24 hours/day (daughter works but husband retired) Type of Home: House Home Access: Stairs to enter Secretary/administrator of Steps: 4 Entrance Stairs-Rails: Right Home Layout: One level     Bathroom Shower/Tub: Chief Strategy Officer: Handicapped height (both) Bathroom  Accessibility: Yes How Accessible: Accessible via walker Home Equipment: None          Prior Functioning/Environment Prior Level of Function : Independent/Modified Independent;Driving                        OT Problem List: Decreased activity tolerance;Impaired balance (sitting and/or standing);Pain;Decreased knowledge of use of DME or AE      OT Treatment/Interventions: Self-care/ADL training;Energy conservation;DME and/or AE instruction;Therapeutic exercise;Therapeutic activities    OT Goals(Current goals can be found in the care plan section) Acute Rehab OT Goals Patient Stated Goal: recover well OT Goal Formulation: With patient Time For Goal Achievement: 11/08/22 Potential to Achieve Goals: Good  OT Frequency: Min 2X/week    Co-evaluation              AM-PAC OT "6 Clicks" Daily Activity     Outcome Measure Help from another person eating meals?: None Help from another person taking care of personal grooming?: A Little Help from another person toileting, which includes using toliet, bedpan, or urinal?: A Little Help from another person bathing (including washing, rinsing, drying)?: A Little Help from another person to put on and taking off regular upper body clothing?: A Little Help from another person to put on and taking off regular lower body clothing?: A Little 6 Click Score: 19   End of Session Equipment Utilized During Treatment: Gait belt;Rolling walker (2 wheels) Nurse Communication: Mobility status  Activity Tolerance: Patient tolerated treatment well  Patient left: Other (comment) (with PT)  OT Visit Diagnosis: Other abnormalities of gait and mobility (R26.89)                Time: 2637-8588 OT Time Calculation (min): 53 min Charges:  OT General Charges $OT Visit: 1 Visit OT Evaluation $OT Eval Low Complexity: 1 Low OT Treatments $Self Care/Home Management : 8-22 mins  Bradd Canary, OTR/L Acute Rehab Services Office: [phone]   Lorre Munroe 10/25/2022, 9:50 AM

## 2022-10-25 NOTE — H&P (Signed)
History and Physical  [name] TGY:[id number] DOB: 02-13-1956 DOA: 10/24/2022  Referring physician: DR. Wilkie Aye, EDP  PCP: Elfredia Nevins, MD  Outpatient Specialists: None Patient coming from: Home  Chief Complaint: Fall.  HPI: [name] is a 66 y.o. female with medical history significant for obesity, who presented to [facility] ED from home after a mechanical fall.  She was walking down the stairs to take her trash out.  She missed and fell off the last step, tried to catch herself with her right hand, but landed in her yard.  She immediately knew something was wrong with her left ankle due to the severe pain and dislocation.  In the ED, EDP discussed the case with orthopedic surgery.  The patient had her ankle reduced in the ED.  She will follow-up with orthopedic surgery within 1 week.  No weightbearing to left lower extremity.  Elevate left lower extremity.  She received IV opioids analgesics and became nauseous.  She received IV antiemetics.  Her nausea persisted.  Due to intractable nausea and vomiting EDP recommended admission for observation.  The patient was admitted by [facility], hospitalist service.  ED Course: BP 131/67, pulse 65, respiration rate 11, O2 saturation 99% on room air.  Lab studies remarkable for WBC 11.2, potassium 3.1.  Review of Systems: Review of systems as noted in the HPI. All other systems reviewed and are negative.   Past Medical History:  Diagnosis Date   Asthma    Back pain    Past Surgical History:  Procedure Laterality Date   BACK SURGERY      Social History:  reports that she does not drink alcohol and does not use drugs. No history on file for tobacco use.   Allergies  Allergen Reactions   Amoxicillin-Pot Clavulanate    Codeine     Family history: Not provided.  Prior to Admission medications   Medication Sig Start Date End Date Taking? Authorizing Provider  ondansetron (ZOFRAN-ODT) 4 MG disintegrating tablet 4mg  ODT q4 hours prn  nausea/vomit 10/25/22  Yes 10/27/22, MD  oxyCODONE-acetaminophen (PERCOCET/ROXICET) 5-325 MG tablet Take 1-2 tablets by mouth every 6 (six) hours as needed for severe pain. 10/25/22  Yes 10/27/22, MD  docusate sodium (COLACE) 100 MG capsule Take 100 mg by mouth daily.    [provider]  ibuprofen (ADVIL,MOTRIN) 200 MG tablet Take 200 mg by mouth every 6 (six) hours as needed. For pain    [provider]  levofloxacin (LEVAQUIN) 500 MG tablet Take 1 tablet (500 mg total) by mouth daily. 12/09/12   02/06/13, PA-C    Physical Exam: BP (!) 168/80   Pulse 69   Temp 98.5 F (36.9 C) (Oral)   Resp (!) 25   Ht 5\' 3"  (1.6 m)   Wt 81.2 kg   SpO2 98%   BMI 31.71 kg/m   General: 66 y.o. year-old female well developed well nourished in no acute distress.  Alert and oriented x3. Cardiovascular: Regular rate and rhythm with no rubs or gallops.  No thyromegaly or JVD noted.  No lower extremity edema. 2/4 pulses in all 4 extremities. Respiratory: Clear to auscultation with no wheezes or rales. Good inspiratory effort. Abdomen: Soft nontender nondistended with normal bowel sounds x4 quadrants. Muskuloskeletal: No cyanosis, clubbing or edema noted bilaterally Neuro: CN II-XII intact, strength, sensation, reflexes Skin: No ulcerative lesions noted or rashes Psychiatry: Judgement and insight appear normal. Mood is appropriate for condition and setting  Labs on Admission:  Basic Metabolic Panel: Recent Labs  Lab 10/24/22 1933  NA 138  K 3.1*  CL 106  CO2 21*  GLUCOSE 119*  BUN 13  CREATININE 1.04*  CALCIUM 9.3   Liver Function Tests: No results for input(s): "AST", "ALT", "ALKPHOS", "BILITOT", "PROT", "ALBUMIN" in the last 168 hours. No results for input(s): "LIPASE", "AMYLASE" in the last 168 hours. No results for input(s): "AMMONIA" in the last 168 hours. CBC: Recent Labs  Lab 10/24/22 1933  WBC 11.2*  NEUTROABS 7.3  HGB 13.2  HCT 39.9   MCV 94.3  PLT 350   Cardiac Enzymes: No results for input(s): "CKTOTAL", "CKMB", "CKMBINDEX", "TROPONINI" in the last 168 hours.  BNP (last 3 results) No results for input(s): "BNP" in the last 8760 hours.  ProBNP (last 3 results) No results for input(s): "PROBNP" in the last 8760 hours.  CBG: No results for input(s): "GLUCAP" in the last 168 hours.  Radiological Exams on Admission: CT Ankle Left Wo Contrast  Result Date: 10/25/2022 CLINICAL DATA:  Left ankle fracture post reduction. EXAM: CT OF THE LEFT ANKLE WITHOUT CONTRAST TECHNIQUE: Multidetector CT imaging of the left ankle was performed according to the standard protocol. Multiplanar CT image reconstructions were also generated. RADIATION DOSE REDUCTION: This exam was performed according to the departmental dose-optimization program which includes automated exposure control, adjustment of the mA and/or kV according to patient size and/or use of iterative reconstruction technique. COMPARISON:  Earlier study yesterday prior to and following reduction. FINDINGS: Bones/Joint/Cartilage The posterior talar dislocation noted previously has been reduced. Mild osteopenia. There is a transverse medial malleolar fracture, predominantly at the level of the talar dome, slightly distracted and otherwise not significantly displaced with small comminution fragments posteriorly which were also nondisplaced. There is oblique intra-articular fracture along the lateral aspect of the tibial plafond with mild distraction up to 3 mm and with mild lateral rotation of the fracture fragment. There is a distal fibular shaft fracture, with the distal fragment having slight posterior angulation, slight medial angulation and 1/3 of a shaft width of lateral displacement of the distal fragment with additional evidence of chronic fracture nonunion of the posterior aspect of the lateral malleolar tip. There is a longitudinal, intra-articular mildly comminuted fracture  extending broadly across the posterior malleolus, with slight posterior and superior distraction, with the fracture line intersecting with the posterior aspect of the medial malleolar fracture line. The talar dome contour is preserved. There is a small os trigonum posteriorly. There is a noninflammatory plantar calcaneal spur. There are no further fractures. There is slight widening along the anterior and medial mortise, narrowing along the lateral mortise, consistent with ligamentous trauma. Ligaments Suboptimally assessed by CT. Muscles and Tendons There is normal muscle bulk within the scanned volume. No intramuscular hematoma is seen. The tendons are not well evaluated with this technique but they appear grossly intact. The tendon laceration or strain or might be visible on CT. Soft tissues There is diffuse edema. No hematoma. No soft tissue gas. No mass is seen. IMPRESSION: 1. Interval reduction of the posterior talar dislocation. 2. Medial and posterior malleolar fractures with comminution, with slight widening along the anterior and medial mortise and narrowing along the lateral mortise consistent with ligamentous trauma, with distal fibular shaft fracture as described above. 3. Additional fracture fragment involving the lateral aspect of the tibial plafond with a slight rotation of the fracture fragment. 4. Osteopenia. 5. Diffuse soft tissue edema. 6. Plantar calcaneal spur. Electronically Signed  By: Almira Bar M.D.   On: 10/25/2022 00:59   DG Ankle Left Port  Result Date: 10/24/2022 CLINICAL DATA:  Overlying cast obscures fine osseous details. Mildly displaced comminuted fracture of the distal fibula. Mildly displaced fracture of the posterior malleolus EXAM: PORTABLE LEFT ANKLE - 2 VIEW COMPARISON:  None Available. FINDINGS: Status post reduction and casting radiographs demonstrate significant improvement in the tibiotalar joint dislocation. Improved alignment of the distal fibular comminuted  fracture. There is also improvement of the alignment of the posterior malleolar fracture. Soft tissue swelling about the ankle. IMPRESSION: Status post reduction and casting radiographs demonstrate significant improvement in the tibiotalar joint dislocation. As well as improved alignment of the fibular and posterior malleolar fractures. Electronically Signed   By: Larose Hires D.O.   On: 10/24/2022 23:20   DG Tibia/Fibula Left Port  Result Date: 10/24/2022 CLINICAL DATA:  Fall.  Left lower leg pain and deformity. EXAM: PORTABLE LEFT TIBIA AND FIBULA - 2 VIEW COMPARISON:  Left ankle radiographs also obtained today FINDINGS: Oblique fracture of the distal fibular metadiaphysis is seen with moderate dorsal angulation of the distal fracture fragment. Fractures are also seen involving the medial and posterior malleoli of the distal tibia. Dorsal dislocation of the talus is also seen. IMPRESSION: Trimalleolar ankle fracture-dislocation, which includes fracture of the distal fibular metadiaphysis. Electronically Signed   By: Danae Orleans M.D.   On: 10/24/2022 22:10   DG Ankle Complete Right  Result Date: 10/24/2022 CLINICAL DATA:  Fall.  Right ankle pain. EXAM: RIGHT ANKLE - COMPLETE 3+ VIEW COMPARISON:  None Available. FINDINGS: There is no evidence of fracture or dislocation. Probable ankle joint effusion noted on the lateral view. There is no evidence of arthropathy. A small plantar calcaneal bone spur is noted. Soft tissues are unremarkable. IMPRESSION: Probable ankle joint effusion. No evidence of fracture or dislocation. Electronically Signed   By: Danae Orleans M.D.   On: 10/24/2022 22:08   DG Ankle Left Port  Result Date: 10/24/2022 CLINICAL DATA:  Fall.  Left ankle pain and deformity. EXAM: PORTABLE LEFT ANKLE - 2 VIEW COMPARISON:  None Available. FINDINGS: Displaced fractures are seen involving the medial and posterior malleoli of the distal tibia. Oblique fractures also seen involving the distal  fibular metadiaphysis, above the level of the tibial plafond, which shows moderate dorsal angulation. Posterior dislocation of the talus is also seen. IMPRESSION: Left ankle fracture-dislocation, as described above. Electronically Signed   By: Danae Orleans M.D.   On: 10/24/2022 22:07    EKG: I independently viewed the EKG done and my findings are as followed: None available at the time of this visit.  Assessment/Plan Present on Admission:  Closed left ankle fracture  Principal Problem:   Closed left ankle fracture  Closed left ankle fracture, post mechanical fall, POA Pain control in place and IV antiemetics Management per orthopedic surgery's recommendation No weightbearing, elevate left lower extremity Follow-up with orthopedic surgery within 1 week.  Obesity BMI 31 Recommend weight loss outpatient with regular physical activity and healthy dieting.  Hypokalemia Serum potassium 3.1 Repleted intravenously with LR KCl 40 mEq at 75 cc/h x 1 day. Repeat BMP in the morning.  Mild non-anion gap metabolic acidosis Serum bicarb 21 Gentle IV fluid hydration Repeat chemistry panel.   DVT prophylaxis: Subcu Lovenox daily  Code Status: Full code  Family Communication: Daughter and husband at bedside.  Disposition Plan: Admitted to telemetry surgical unit  Consults called: Orthopedic surgery  Admission status: Observation status  Status is: Observation    Darlin Droparole N Amal Renbarger MD [facility] Pager [phone]  If 7PM-7AM, please contact night-coverage [email or web address] Password [email or web address]  10/25/2022, 1:28 AM

## 2022-10-25 NOTE — ED Notes (Signed)
PT at bedside with patient

## 2022-10-25 NOTE — Discharge Instructions (Addendum)
For severe pain take norco or vicodin however realize they have the potential for addiction and it can make you sleepy and has tylenol in it.  No operating machinery while taking.  keep foot elevated higher than hear nose as much as possible as this will continue to swell ("toes above nose") NWB LLE F/u within 1 week with Dr. Sherilyn Dacosta

## 2022-10-25 NOTE — ED Notes (Signed)
Pt verbalized understanding of discharge instructions. Opportunity for questions provided.  

## 2022-10-25 NOTE — ED Notes (Addendum)
Per CSW no walker order entered. New order for walker entered. Pt was updated.

## 2022-10-25 NOTE — ED Notes (Signed)
Pt transported to CT ?

## 2022-10-25 NOTE — ED Notes (Signed)
Pt was able to ambulate to restroom w/ walker prior to discharge.

## 2022-10-25 NOTE — ED Notes (Signed)
Pt waiting for walker to be provided by CSW.  Pt voiced concerns about delay and reassured the issues it being addressed.

## 2022-10-25 NOTE — Evaluation (Signed)
Physical Therapy Evaluation Patient Details Name: [name] MRN: [id number] DOB: 09/17/1956 Today's Date: 10/25/2022  History of Present Illness  Pt is a 66 y/o female who presented after a fall at home resulting in L ankle dislocation, medial and posterior malleolar fx. Ankle reduced in ED, splint applied, nonoperative mgmt until swelling decreases. PMH: asthma, back sx  Clinical Impression  Pt admitted with above diagnosis. Pt does well using RW safely in controlled environment. Pt had difficulty with steps and was only able to complete 1 step and needed mod assist of 2. Discussed ramp and obtaining wheelchair with pt as she states she has a friend that she will borrow a wheelchair from.  Will return with handouts for pt.  Pt most likely to d/c home today.  Pt currently with functional limitations due to the deficits listed below (see PT Problem List). Pt will benefit from skilled PT to increase their independence and safety with mobility to allow discharge to the venue listed below.          Recommendations for follow up therapy are one component of a multi-disciplinary discharge planning process, led by the attending physician.  Recommendations may be updated based on patient status, additional functional criteria and insurance authorization.  Follow Up Recommendations Home health PT (Pt declined HHPT stating her daughter "can help with what they help with")      Assistance Recommended at Discharge Intermittent Supervision/Assistance  Patient can return home with the following  A little help with walking and/or transfers;A little help with bathing/dressing/bathroom;Assistance with cooking/housework;Assist for transportation;Help with stairs or ramp for entrance    Equipment Recommendations Rolling walker (2 wheels)  Recommendations for Other Services       Functional Status Assessment Patient has had a recent decline in their functional status and demonstrates the ability to make  significant improvements in function in a reasonable and predictable amount of time.     Precautions / Restrictions Precautions Precautions: Fall Required Braces or Orthoses: Splint/Cast Splint/Cast: L ankle Restrictions Weight Bearing Restrictions: Yes LLE Weight Bearing: Non weight bearing      Mobility  Bed Mobility Overal bed mobility: Modified Independent                  Transfers Overall transfer level: Needs assistance Equipment used: Rolling walker (2 wheels) Transfers: Sit to/from Stand, Bed to chair/wheelchair/BSC Sit to Stand: Min guard   Step pivot transfers: Min guard       General transfer comment: multiple transfers to/from transport chair and toilet with minor cues for sequencing    Ambulation/Gait Ambulation/Gait assistance: Min guard Gait Distance (Feet): 35 Feet Assistive device: Rolling walker (2 wheels) Gait Pattern/deviations: Step-to pattern, Decreased stride length, Antalgic   Gait velocity interpretation: <1.31 ft/sec, indicative of household ambulator   General Gait Details: Pt was able to ambulate with RW wth overall good safety. No LOB and able to maintain NWB left LE.  Stairs Stairs: Yes Stairs assistance: Min guard, +2 physical assistance, Min assist Stair Management: One rail Right, Step to pattern, Forwards, With walker Number of Stairs: 1 General stair comments: Attempted stairs and showed pt how to ascend and descend posterioly with RW hwoever pt did not want to practice that way.  Tried up and down with right rail and walker folded and pt was able to do 1 step but needed +2 mod assist and felt that right ankle/LE was overly stressed as it is weak per pt therefore had pt only practice 1 step  and discussed other options. Educated pt in up and down steps on bottom, up and down in wheelchair and geting ramp.  Wheelchair Mobility    Modified Rankin (Stroke Patients Only)       Balance Overall balance assessment: Needs  assistance Sitting-balance support: No upper extremity supported, Feet supported Sitting balance-Leahy Scale: Good     Standing balance support: No upper extremity supported, During functional activity, Bilateral upper extremity supported Standing balance-Leahy Scale: Poor Standing balance comment: reliant on at least one UE support due to NWB status                             Pertinent Vitals/Pain Pain Assessment Pain Assessment: Faces Faces Pain Scale: Hurts little more Pain Location: L ankle Pain Descriptors / Indicators: Tightness Pain Intervention(s): Limited activity within patient's tolerance, Monitored during session, Repositioned    Home Living Family/patient expects to be discharged to:: Private residence Living Arrangements: Spouse/significant other Available Help at Discharge: Family;Available 24 hours/day (daughter works but husband retired) Type of Home: House Home Access: Stairs to enter Entrance Stairs-Rails: Engineer, manufacturing of Steps: 4   Home Layout: One level Home Equipment: None      Prior Function Prior Level of Function : Independent/Modified Independent;Driving                     Hand Dominance   Dominant Hand: Right    Extremity/Trunk Assessment   Upper Extremity Assessment Upper Extremity Assessment: Defer to OT evaluation    Lower Extremity Assessment Lower Extremity Assessment: RLE deficits/detail;LLE deficits/detail RLE Deficits / Details: grossly3/5 LLE: Unable to fully assess due to pain;Unable to fully assess due to immobilization    Cervical / Trunk Assessment Cervical / Trunk Assessment: Normal  Communication   Communication: No difficulties  Cognition Arousal/Alertness: Awake/alert Behavior During Therapy: WFL for tasks assessed/performed Overall Cognitive Status: Within Functional Limits for tasks assessed                                          General Comments General  comments (skin integrity, edema, etc.): VSS    Exercises     Assessment/Plan    PT Assessment Patient needs continued PT services  PT Problem List Decreased activity tolerance;Decreased balance;Decreased mobility;Decreased knowledge of use of DME;Decreased safety awareness;Decreased knowledge of precautions       PT Treatment Interventions DME instruction;Gait training;Functional mobility training;Therapeutic activities;Therapeutic exercise;Balance training;Patient/family education;Stair training    PT Goals (Current goals can be found in the Care Plan section)  Acute Rehab PT Goals Patient Stated Goal: to go home PT Goal Formulation: With patient Time For Goal Achievement: 11/08/22 Potential to Achieve Goals: Good    Frequency Min 5X/week     Co-evaluation               AM-PAC PT "6 Clicks" Mobility  Outcome Measure Help needed turning from your back to your side while in a flat bed without using bedrails?: None Help needed moving from lying on your back to sitting on the side of a flat bed without using bedrails?: A Little Help needed moving to and from a bed to a chair (including a wheelchair)?: A Little Help needed standing up from a chair using your arms (e.g., wheelchair or bedside chair)?: A Little Help needed to walk in hospital room?: A  Little Help needed climbing 3-5 steps with a railing? : Total 6 Click Score: 17    End of Session Equipment Utilized During Treatment: Gait belt Activity Tolerance: Patient limited by fatigue Patient left: with call bell/phone within reach (on stretcher) Nurse Communication: Mobility status PT Visit Diagnosis: Muscle weakness (generalized) (M62.81)    Time: 0924-1000 PT Time Calculation (min) (ACUTE ONLY): 36 min   Charges:   PT Evaluation $PT Eval Moderate Complexity: 1 Mod PT Treatments $Gait Training: 8-22 mins        Velita Quirk M,PT Acute Rehab Services [phone]   Bevelyn Buckles 10/25/2022, 1:22 PM

## 2022-10-25 NOTE — ED Notes (Addendum)
Spoke to CSW and there was never a walker ordered.  New order placed.  Pt updated.   CLARIFICATION: 3 orders have been placed for walker: first order 9:53AM

## 2022-10-25 NOTE — Progress Notes (Signed)
OT Cancellation Note  Patient Details Name: [name] MRN: [id number] DOB: [birth year]   Cancelled Treatment:    Reason Eval/Treat Not Completed: Other (comment) Currently with breakfast tray. Educated pt on plan for PT/OT evals this AM and pt agreeable for OT to return   Lorre Munroe 10/25/2022, 7:57 AM

## 2022-10-25 NOTE — Progress Notes (Signed)
10/25/22 1300  PT Visit Information  Last PT Received On 10/25/22  Assistance Needed +1 (+2 stairs)  History of Present Illness Pt is a 66 y/o female who presented after a fall at home resulting in L ankle dislocation, medial and posterior malleolar fx. Ankle reduced in ED, splint applied, nonoperative mgmt until swelling decreases. PMH: asthma, back sx  Subjective Data  Patient Stated Goal to go home  Precautions  Precautions Fall  Required Braces or Orthoses Splint/Cast  Splint/Cast L ankle  Restrictions  Weight Bearing Restrictions Yes  LLE Weight Bearing NWB  Pain Assessment  Pain Assessment Faces  Faces Pain Scale 4  Pain Location L ankle  Pain Descriptors / Indicators Tightness  Pain Intervention(s) Limited activity within patient's tolerance;Monitored during session;Repositioned  Cognition  Arousal/Alertness Awake/alert  Behavior During Therapy WFL for tasks assessed/performed  Overall Cognitive Status Within Functional Limits for tasks assessed  Bed Mobility  Overal bed mobility Modified Independent  General Comments  General comments (skin integrity, edema, etc.) VSS, husband present  PT - End of Session  Equipment Utilized During Treatment Gait belt  Activity Tolerance Patient limited by fatigue  Patient left with call bell/phone within reach (on stretcher)  Nurse Communication Mobility status   PT - Assessment/Plan  PT Plan Current plan remains appropriate  PT Visit Diagnosis Muscle weakness (generalized) (M62.81)  PT Frequency (ACUTE ONLY) Min 5X/week  Follow Up Recommendations Home health PT (Pt declined HHPT stating her daughter "can help with what they help with")  Assistance recommended at discharge Intermittent Supervision/Assistance  Patient can return home with the following A little help with walking and/or transfers;A little help with bathing/dressing/bathroom;Assistance with cooking/housework;Assist for transportation;Help with stairs or ramp for  entrance  PT equipment Rolling walker (2 wheels)  AM-PAC PT "6 Clicks" Mobility Outcome Measure (Version 2)  Help needed turning from your back to your side while in a flat bed without using bedrails? 4  Help needed moving from lying on your back to sitting on the side of a flat bed without using bedrails? 3  Help needed moving to and from a bed to a chair (including a wheelchair)? 3  Help needed standing up from a chair using your arms (e.g., wheelchair or bedside chair)? 3  Help needed to walk in hospital room? 3  Help needed climbing 3-5 steps with a railing?  1  6 Click Score 17  Consider Recommendation of Discharge To: Home with [facility]  Progressive Mobility  What is the highest level of mobility based on the progressive mobility assessment? Level 4 (Walks with assist in room) - Balance while marching in place and cannot step forward and back - Complete  Mobility Referral Yes  Activity Ambulated with assistance in room  PT Goal Progression  Progress towards PT goals Progressing toward goals  PT Time Calculation  PT Start Time (ACUTE ONLY) 1042  PT Stop Time (ACUTE ONLY) 1057  PT Time Calculation (min) (ACUTE ONLY) 15 min  PT General Charges  $$ ACUTE PT VISIT 1 Visit  PT Treatments  $Self Care/Home Management 8-22   Returned to pts room to give the following handouts: Stairs with RW, Stairs with handrail and walker, stairs with wheelchair, Ramp handout, Falls At Home.  Educated pt and husband as to safety in the home and discussed pts plan for going home.  Pt states that daughter is working on getting a wheelchair as well as getting portable ramp vs she and the husband using wheelchair to get pt  in. All ed completed with pt and husband.  Issued gait belt for family to use as well.  Devyne Hauger M,PT Acute Rehab Services [phone]

## 2022-10-25 NOTE — ED Notes (Signed)
Phlebotomy attempted to collect blood, unable to obtain
